# Patient Record
Sex: Male | Born: 1975 | Race: White | Hispanic: No | Marital: Married
Health system: Midwestern US, Academic
[De-identification: ages and names within clinical notes are randomized; demographics above are authoritative.]

---

## 2017-10-28 ENCOUNTER — Encounter: Admit: 2017-10-28 | Discharge: 2017-10-29 | Payer: 59

## 2017-11-12 ENCOUNTER — Encounter: Admit: 2017-11-12 | Discharge: 2017-11-13 | Payer: 59

## 2017-11-23 ENCOUNTER — Encounter: Admit: 2017-11-23 | Discharge: 2017-11-24 | Payer: 59

## 2017-12-07 ENCOUNTER — Encounter: Admit: 2017-12-07 | Discharge: 2017-12-08 | Payer: 59

## 2018-01-26 ENCOUNTER — Encounter: Admit: 2018-01-26 | Discharge: 2018-01-26 | Payer: 59

## 2018-01-28 ENCOUNTER — Encounter: Admit: 2018-01-28 | Discharge: 2018-01-28 | Payer: 59

## 2018-01-28 DIAGNOSIS — M545 Low back pain: Principal | ICD-10-CM

## 2018-01-29 ENCOUNTER — Ambulatory Visit: Admit: 2018-01-29 | Discharge: 2018-01-29 | Payer: 59

## 2018-01-29 ENCOUNTER — Encounter: Admit: 2018-01-29 | Discharge: 2018-01-29 | Payer: 59

## 2018-01-29 DIAGNOSIS — M545 Low back pain: Principal | ICD-10-CM

## 2018-01-29 DIAGNOSIS — S22000A Wedge compression fracture of unspecified thoracic vertebra, initial encounter for closed fracture: ICD-10-CM

## 2018-01-30 ENCOUNTER — Encounter: Admit: 2018-01-30 | Discharge: 2018-01-30 | Payer: 59

## 2018-02-01 ENCOUNTER — Encounter: Admit: 2018-02-01 | Discharge: 2018-02-01 | Payer: 59

## 2018-02-01 DIAGNOSIS — F419 Anxiety disorder, unspecified: ICD-10-CM

## 2018-02-01 DIAGNOSIS — M545 Low back pain: Principal | ICD-10-CM

## 2018-02-12 ENCOUNTER — Encounter: Admit: 2018-02-12 | Discharge: 2018-02-12 | Payer: 59

## 2018-02-12 ENCOUNTER — Ambulatory Visit: Admit: 2018-02-12 | Discharge: 2018-02-13 | Payer: 59

## 2018-02-12 DIAGNOSIS — M545 Low back pain: Principal | ICD-10-CM

## 2018-02-12 DIAGNOSIS — F419 Anxiety disorder, unspecified: ICD-10-CM

## 2018-02-12 MED ORDER — GABAPENTIN 300 MG PO CAP
900 mg | ORAL_CAPSULE | Freq: Three times a day (TID) | ORAL | 3 refills | Status: AC
Start: 2018-02-12 — End: 2018-02-16

## 2018-02-12 MED ORDER — NORTRIPTYLINE 25 MG PO CAP
75 mg | ORAL_CAPSULE | Freq: Every evening | ORAL | 3 refills | Status: AC
Start: 2018-02-12 — End: 2018-02-24

## 2018-02-13 DIAGNOSIS — S22000A Wedge compression fracture of unspecified thoracic vertebra, initial encounter for closed fracture: Principal | ICD-10-CM

## 2018-02-13 DIAGNOSIS — M79605 Pain in left leg: ICD-10-CM

## 2018-02-13 DIAGNOSIS — M47814 Spondylosis without myelopathy or radiculopathy, thoracic region: ICD-10-CM

## 2018-02-13 DIAGNOSIS — M461 Sacroiliitis, not elsewhere classified: ICD-10-CM

## 2018-02-15 ENCOUNTER — Encounter: Admit: 2018-02-15 | Discharge: 2018-02-15 | Payer: 59

## 2018-02-15 DIAGNOSIS — M545 Low back pain: Principal | ICD-10-CM

## 2018-02-15 DIAGNOSIS — F419 Anxiety disorder, unspecified: ICD-10-CM

## 2018-02-16 MED ORDER — GABAPENTIN 600 MG PO TAB
600 mg | ORAL_TABLET | Freq: Three times a day (TID) | ORAL | 3 refills | Status: AC
Start: 2018-02-16 — End: 2018-03-16

## 2018-02-16 MED ORDER — GABAPENTIN 300 MG PO CAP
300 mg | ORAL_CAPSULE | Freq: Three times a day (TID) | ORAL | 3 refills | Status: AC
Start: 2018-02-16 — End: 2018-03-16

## 2018-02-18 ENCOUNTER — Encounter: Admit: 2018-02-18 | Discharge: 2018-02-18 | Payer: 59

## 2018-02-18 ENCOUNTER — Ambulatory Visit: Admit: 2018-02-18 | Discharge: 2018-02-18 | Payer: 59

## 2018-02-18 ENCOUNTER — Ambulatory Visit: Admit: 2018-02-18 | Discharge: 2018-02-19 | Payer: 59

## 2018-02-18 DIAGNOSIS — R52 Pain, unspecified: ICD-10-CM

## 2018-02-18 DIAGNOSIS — M545 Low back pain: Principal | ICD-10-CM

## 2018-02-18 DIAGNOSIS — M461 Sacroiliitis, not elsewhere classified: Principal | ICD-10-CM

## 2018-02-18 DIAGNOSIS — F419 Anxiety disorder, unspecified: ICD-10-CM

## 2018-02-18 MED ORDER — TRIAMCINOLONE ACETONIDE 40 MG/ML IJ SUSP
40 mg | Freq: Once | INTRAMUSCULAR | 0 refills | Status: CP
Start: 2018-02-18 — End: ?

## 2018-02-18 MED ORDER — LIDOCAINE HCL 10 MG/ML (1 %) IJ SOLN
2 mL | Freq: Once | INTRAMUSCULAR | 0 refills | Status: CP
Start: 2018-02-18 — End: ?

## 2018-02-18 MED ORDER — LIDOCAINE (PF) 10 MG/ML (1 %) IJ SOLN
4 mL | Freq: Once | INTRAMUSCULAR | 0 refills | Status: CP | PRN
Start: 2018-02-18 — End: ?

## 2018-02-18 MED ORDER — OTHER MEDICATION
1 | Freq: Once | INTRAMUSCULAR | 0 refills | Status: CP
Start: 2018-02-18 — End: ?

## 2018-02-18 MED ORDER — TRIAMCINOLONE ACETONIDE 40 MG/ML IJ SUSP
40 mg | Freq: Once | INTRAMUSCULAR | 0 refills | Status: CP | PRN
Start: 2018-02-18 — End: ?

## 2018-02-19 ENCOUNTER — Encounter: Admit: 2018-02-19 | Discharge: 2018-02-19 | Payer: 59

## 2018-02-23 ENCOUNTER — Encounter: Admit: 2018-02-23 | Discharge: 2018-02-23 | Payer: 59

## 2018-02-24 MED ORDER — NORTRIPTYLINE 50 MG PO CAP
100 mg | ORAL_CAPSULE | Freq: Every evening | ORAL | 0 refills | Status: AC
Start: 2018-02-24 — End: 2018-03-29

## 2018-03-04 ENCOUNTER — Encounter: Admit: 2018-03-04 | Discharge: 2018-03-04 | Payer: 59

## 2018-03-04 ENCOUNTER — Ambulatory Visit: Admit: 2018-03-04 | Discharge: 2018-03-04 | Payer: 59

## 2018-03-04 ENCOUNTER — Ambulatory Visit: Admit: 2018-03-04 | Discharge: 2018-03-05 | Payer: 59

## 2018-03-04 DIAGNOSIS — M546 Pain in thoracic spine: ICD-10-CM

## 2018-03-04 DIAGNOSIS — F419 Anxiety disorder, unspecified: ICD-10-CM

## 2018-03-04 DIAGNOSIS — M545 Low back pain: Principal | ICD-10-CM

## 2018-03-04 DIAGNOSIS — M47814 Spondylosis without myelopathy or radiculopathy, thoracic region: Principal | ICD-10-CM

## 2018-03-04 DIAGNOSIS — R52 Pain, unspecified: Principal | ICD-10-CM

## 2018-03-04 MED ORDER — BUPIVACAINE (PF) 0.5 % (5 MG/ML) IJ SOLN
6 mL | Freq: Once | INTRAMUSCULAR | 0 refills | Status: CP
Start: 2018-03-04 — End: ?
  Administered 2018-03-04: 18:00:00 6 mL via INTRAMUSCULAR

## 2018-03-05 ENCOUNTER — Encounter: Admit: 2018-03-05 | Discharge: 2018-03-05 | Payer: 59

## 2018-03-16 ENCOUNTER — Encounter: Admit: 2018-03-16 | Discharge: 2018-03-16 | Payer: 59

## 2018-03-16 MED ORDER — GABAPENTIN 300 MG PO CAP
300 mg | ORAL_CAPSULE | Freq: Three times a day (TID) | ORAL | 3 refills | Status: AC
Start: 2018-03-16 — End: 2018-10-14

## 2018-03-16 MED ORDER — GABAPENTIN 600 MG PO TAB
600 mg | ORAL_TABLET | Freq: Three times a day (TID) | ORAL | 3 refills | Status: AC
Start: 2018-03-16 — End: 2018-10-14

## 2018-03-18 ENCOUNTER — Ambulatory Visit: Admit: 2018-03-18 | Discharge: 2018-03-19 | Payer: 59

## 2018-03-18 ENCOUNTER — Encounter: Admit: 2018-03-18 | Discharge: 2018-03-18 | Payer: 59

## 2018-03-18 ENCOUNTER — Ambulatory Visit: Admit: 2018-03-18 | Discharge: 2018-03-18 | Payer: 59

## 2018-03-18 DIAGNOSIS — R52 Pain, unspecified: ICD-10-CM

## 2018-03-18 DIAGNOSIS — M5134 Other intervertebral disc degeneration, thoracic region: ICD-10-CM

## 2018-03-18 DIAGNOSIS — M545 Low back pain: Principal | ICD-10-CM

## 2018-03-18 DIAGNOSIS — F419 Anxiety disorder, unspecified: ICD-10-CM

## 2018-03-18 DIAGNOSIS — M47814 Spondylosis without myelopathy or radiculopathy, thoracic region: Principal | ICD-10-CM

## 2018-03-18 DIAGNOSIS — M47819 Spondylosis without myelopathy or radiculopathy, site unspecified: ICD-10-CM

## 2018-03-18 MED ORDER — BUPIVACAINE (PF) 0.5 % (5 MG/ML) IJ SOLN
6 mL | Freq: Once | INTRAMUSCULAR | 0 refills | Status: CP
Start: 2018-03-18 — End: ?
  Administered 2018-03-18: 18:00:00 6 mL via INTRAMUSCULAR

## 2018-03-19 ENCOUNTER — Encounter: Admit: 2018-03-19 | Discharge: 2018-03-19 | Payer: 59

## 2018-03-19 DIAGNOSIS — M47814 Spondylosis without myelopathy or radiculopathy, thoracic region: Principal | ICD-10-CM

## 2018-03-19 DIAGNOSIS — M79604 Pain in right leg: ICD-10-CM

## 2018-03-25 ENCOUNTER — Encounter: Admit: 2018-03-25 | Discharge: 2018-03-25 | Payer: 59

## 2018-03-31 ENCOUNTER — Ambulatory Visit: Admit: 2018-03-31 | Discharge: 2018-03-31 | Payer: 59

## 2018-03-31 ENCOUNTER — Encounter: Admit: 2018-03-31 | Discharge: 2018-03-31 | Payer: 59

## 2018-03-31 ENCOUNTER — Ambulatory Visit: Admit: 2018-03-31 | Discharge: 2018-04-01 | Payer: 59

## 2018-03-31 DIAGNOSIS — F419 Anxiety disorder, unspecified: ICD-10-CM

## 2018-03-31 DIAGNOSIS — M47814 Spondylosis without myelopathy or radiculopathy, thoracic region: Principal | ICD-10-CM

## 2018-03-31 DIAGNOSIS — M546 Pain in thoracic spine: ICD-10-CM

## 2018-03-31 DIAGNOSIS — M545 Low back pain: Principal | ICD-10-CM

## 2018-03-31 MED ORDER — FENTANYL CITRATE (PF) 50 MCG/ML IJ SOLN
50-100 ug | INTRAVENOUS | 0 refills | Status: DC | PRN
Start: 2018-03-31 — End: 2018-04-12

## 2018-03-31 MED ORDER — MIDAZOLAM 1 MG/ML IJ SOLN
1-2 mg | INTRAVENOUS | 0 refills | Status: DC | PRN
Start: 2018-03-31 — End: 2018-04-12

## 2018-03-31 MED ORDER — LIDOCAINE (PF) 20 MG/ML (2 %) IJ SOLN
5 mL | Freq: Once | INTRAMUSCULAR | 0 refills | Status: CP
Start: 2018-03-31 — End: ?

## 2018-03-31 MED ORDER — DEXAMETHASONE SODIUM PHOS (PF) 10 MG/ML IJ SOLN
10 mg | Freq: Once | 0 refills | Status: CP
Start: 2018-03-31 — End: ?

## 2018-04-01 ENCOUNTER — Encounter: Admit: 2018-04-01 | Discharge: 2018-04-01 | Payer: 59

## 2018-04-09 ENCOUNTER — Encounter: Admit: 2018-04-09 | Discharge: 2018-04-09 | Payer: 59

## 2018-04-09 ENCOUNTER — Ambulatory Visit: Admit: 2018-04-09 | Discharge: 2018-04-10 | Payer: 59

## 2018-04-09 DIAGNOSIS — F419 Anxiety disorder, unspecified: ICD-10-CM

## 2018-04-09 DIAGNOSIS — M545 Low back pain: Principal | ICD-10-CM

## 2018-04-10 DIAGNOSIS — M79604 Pain in right leg: Principal | ICD-10-CM

## 2018-04-10 DIAGNOSIS — M79605 Pain in left leg: ICD-10-CM

## 2018-04-13 ENCOUNTER — Ambulatory Visit: Admit: 2018-04-13 | Discharge: 2018-04-13 | Payer: 59

## 2018-04-13 ENCOUNTER — Encounter: Admit: 2018-04-13 | Discharge: 2018-04-13 | Payer: 59

## 2018-04-13 ENCOUNTER — Ambulatory Visit: Admit: 2018-04-13 | Discharge: 2018-04-14 | Payer: 59

## 2018-04-13 DIAGNOSIS — M47814 Spondylosis without myelopathy or radiculopathy, thoracic region: Principal | ICD-10-CM

## 2018-04-13 DIAGNOSIS — M47816 Spondylosis without myelopathy or radiculopathy, lumbar region: ICD-10-CM

## 2018-04-13 DIAGNOSIS — F419 Anxiety disorder, unspecified: ICD-10-CM

## 2018-04-13 DIAGNOSIS — M545 Low back pain: Principal | ICD-10-CM

## 2018-04-13 MED ORDER — LIDOCAINE (PF) 20 MG/ML (2 %) IJ SOLN
5 mL | Freq: Once | INTRAMUSCULAR | 0 refills | Status: CP
Start: 2018-04-13 — End: ?

## 2018-04-13 MED ORDER — MIDAZOLAM 1 MG/ML IJ SOLN
1-2 mg | INTRAVENOUS | 0 refills | Status: AC | PRN
Start: 2018-04-13 — End: 2019-09-28

## 2018-04-13 MED ORDER — LIDOCAINE (PF) 10 MG/ML (1 %) IJ SOLN
5 mL | Freq: Once | INTRAMUSCULAR | 0 refills | Status: CP
Start: 2018-04-13 — End: ?

## 2018-04-13 MED ORDER — DEXAMETHASONE SODIUM PHOS (PF) 10 MG/ML IJ SOLN
10 mg | Freq: Once | 0 refills | Status: CP
Start: 2018-04-13 — End: ?

## 2018-04-13 MED ORDER — FENTANYL CITRATE (PF) 50 MCG/ML IJ SOLN
50-100 ug | INTRAVENOUS | 0 refills | Status: AC | PRN
Start: 2018-04-13 — End: 2019-09-28

## 2018-10-14 ENCOUNTER — Encounter: Admit: 2018-10-14 | Discharge: 2018-10-14 | Payer: 59

## 2018-10-14 MED ORDER — GABAPENTIN 600 MG PO TAB
600 mg | ORAL_TABLET | Freq: Three times a day (TID) | ORAL | 1 refills | Status: AC
Start: 2018-10-14 — End: 2019-02-14

## 2018-10-14 MED ORDER — GABAPENTIN 300 MG PO CAP
300 mg | ORAL_CAPSULE | Freq: Three times a day (TID) | ORAL | 1 refills | Status: AC
Start: 2018-10-14 — End: 2019-02-14

## 2019-01-17 ENCOUNTER — Encounter: Admit: 2019-01-17 | Discharge: 2019-01-17

## 2019-01-17 DIAGNOSIS — M7918 Myalgia, other site: Secondary | ICD-10-CM

## 2019-01-17 DIAGNOSIS — M5416 Radiculopathy, lumbar region: Secondary | ICD-10-CM

## 2019-01-17 NOTE — Telephone Encounter
Patient reports that pain relief was 100% for 9 months after RFA.     Then pain started to lower back last week.   This weekend, pain started down right leg to foot with some numbness to foot.     Pt requests to continue to a procedure.     Route to Dr. Zollie Scale.

## 2019-01-19 ENCOUNTER — Encounter: Admit: 2019-01-19 | Discharge: 2019-01-19

## 2019-01-25 ENCOUNTER — Encounter: Admit: 2019-01-25 | Discharge: 2019-01-25

## 2019-01-26 MED ORDER — PREGABALIN 100 MG PO CAP
ORAL_CAPSULE | Freq: Every evening | ORAL | 3 refills | Status: AC
Start: 2019-01-26 — End: ?

## 2019-01-26 NOTE — Telephone Encounter
Asks if anything to take for the pain prior to procedure on 7/2.     Route to Dr. Zollie Scale.

## 2019-01-27 ENCOUNTER — Encounter: Admit: 2019-01-27 | Discharge: 2019-01-27

## 2019-02-03 ENCOUNTER — Encounter: Admit: 2019-02-03 | Discharge: 2019-02-03

## 2019-02-03 ENCOUNTER — Ambulatory Visit: Admit: 2019-02-03 | Discharge: 2019-02-03

## 2019-02-03 DIAGNOSIS — M5137 Other intervertebral disc degeneration, lumbosacral region: Secondary | ICD-10-CM

## 2019-02-03 DIAGNOSIS — R52 Pain, unspecified: Secondary | ICD-10-CM

## 2019-02-03 DIAGNOSIS — M5416 Radiculopathy, lumbar region: Principal | ICD-10-CM

## 2019-02-03 DIAGNOSIS — M545 Low back pain: Secondary | ICD-10-CM

## 2019-02-03 DIAGNOSIS — F419 Anxiety disorder, unspecified: Secondary | ICD-10-CM

## 2019-02-03 MED ORDER — IOHEXOL 300 MG IODINE/ML IV SOLN
2 mL | Freq: Once | 0 refills | Status: CP
Start: 2019-02-03 — End: ?

## 2019-02-03 MED ORDER — LIDOCAINE (PF) 10 MG/ML (1 %) IJ SOLN
2 mL | Freq: Once | INTRAMUSCULAR | 0 refills | Status: CP
Start: 2019-02-03 — End: ?

## 2019-02-03 MED ORDER — DEXAMETHASONE SODIUM PHOS (PF) 10 MG/ML IJ SOLN
15 mg | Freq: Once | 0 refills | Status: CP
Start: 2019-02-03 — End: ?

## 2019-02-03 NOTE — Progress Notes
[facility]  INTERVENTIONAL PAIN PROCEDURE HISTORY AND PHYSICAL    No chief complaint on file.  LBP    HISTORY OF PRESENT ILLNESS:  LBP with RLE radicular pain.     Medical History:   Diagnosis Date    Anxiety disorder     from patient questionnaire    Low back pain        Surgical History:   Procedure Laterality Date    HX TONSILLECTOMY  1982       family history is not on file.    Social History     Socioeconomic History    Marital status: Married     Spouse name: Not on file    Number of children: Not on file    Years of education: Not on file    Highest education level: Not on file   Occupational History    Not on file   Tobacco Use    Smoking status: Never Smoker    Smokeless tobacco: Never Used   Substance and Sexual Activity    Alcohol use: Yes     Comment: 3 per week    Drug use: Never    Sexual activity: Yes   Other Topics Concern    Not on file   Social History Narrative    Not on file       No Known Allergies    There were no vitals filed for this visit.    REVIEW OF SYSTEMS: 10 point ROS obtained and negative      PHYSICAL EXAM:    General: The patient is a well-developed, well nourished 43 y.o. male in no acute distress.   HEENT: Head is normocephalic and atraumatic.   Cardiac: Based on palpation, pulse appears to be regular rate and rhythm.   Pulmonary: The patient has unlabored respirations and bilateral symmetric chest excursion.   Abdomen: Soft, nontender, and nondistended.   Extremities: No clubbing, cyanosis, or edema.     Neurologic:   The patient is alert and oriented times 3.     Musculoskeletal:     L-Spine   There is mild-mod paraspinal tenderness.        IMPRESSION:    1. Lumbar radiculopathy    2. DDD (degenerative disc disease), lumbosacral         PLAN: Lumbar Transforaminal Steroid Injection Right L4-S1

## 2019-02-03 NOTE — Discharge Instructions - Supplementary Instructions
GENERAL POST PROCEDURE INSTRUCTIONS    Physician: _________________________________    Procedure Completed Today:  o Joint Injection (hip, knee, shoulder)  o Cervical Epidural Steroid Injection  o Cervical Transforaminal Steroid Injection  o Trigger Point Injection  o Other___________________________ o Thoracic Epidural Steroid Injection  o Lumbar Epidural Steroid Injection  o Lumbar Transforaminal Steroid Injection  o Facet Joint Injection     Important information following your procedure today:  ? You may drive today     ? If you had sedation, you may NOT drive today   Rest at home for the next 6 hours.  You may then begin to resume your normal activities.   DO NOT drive any vehicle, operate any power tools, drink alcohol, make any major decisions, or sign any legal documents for the next 12 hours.  1. Pain relief may not be immediate. It is possible you may even experience an increase in pain during the first 24-48 hours followed by a          gradual decrease of your pain.  2. Though the procedure is generally safe and complications are rare, we do ask that you be aware of any of the following:  ? Any swelling, persistent redness, new bleeding or drainage from the site of the injection.  ? You should not experience a severe headache.  ? You should not run a fever over 101oF.  ? New onset of sharp, severe back and or neck pain.  ? New onset of upper or lower extremity numbness or weakness.  ? New difficulty controlling bowel or bladder function after injection.  ? New shortness of breath.  If any of these occur, please call to report this occurrence to a nurse at [phone]. If you are calling after 4:00pm, on the weekends or holidays please call [phone] and ask to have the pain management resident physician on call for the physician paged or go to your local emergency room.   3. You may experience soreness at the injection site. Ice can be applied at 20 minute intervals for the first 24 hours. The following day              you may alternate ice with heat if you are experiencing muscle tightness, otherwise continue with ice. Ice works best at decreasing           pain. Avoid application of direct heat, hot showers or hot tubs today.  4. Avoid strenuous activity today. You many resume your regular activities and exercise tomorrow.  5. Patients with diabetes may see an elevation in blood sugars for 7-10 days after the injection. It is important to pay close attention to               your diet, check your blood sugars daily and repost extreme elevations to the physician that treats your diabetes.  6. Patients taking daily blood thinners can resume their regular dose this evening.  7. It is important that you take all medications ordered by your pain physician. Taking medications as ordered is an important part of you           pain care plan. If you cannot continue the medication plan, please notify the physician.    Possible side effects to steroids that may occur:  ? Flushing or redness of the face  ? Irritability  ? Fluid retention  ? Change in women's menses      Follow up appointment as needed if in the event  you are unable to keep an appointment please notify the scheduler 24 hours in advance at [phone]. If you have questions for the surgery center, call [facility] at [phone].

## 2019-02-03 NOTE — Procedures
Attending Surgeon: Evelina Bucy, MD    Anesthesia: Local    Pre-Procedure Diagnosis:   1. Lumbar radiculopathy    2. DDD (degenerative disc disease), lumbosacral        Post-Procedure Diagnosis:   1. Lumbar radiculopathy    2. DDD (degenerative disc disease), lumbosacral        [place] [facility] INJECT SNRB/TFESI LUMBAR/SACRAL  Procedure: transforaminal epidural    Laterality: right    Location: lumbar - L4-5 and L5-S1      Consent:   Consent obtained: written  Consent given by: patient  Risks discussed: allergic reaction, bleeding, bruising, infection, nerve damage, no change or worsening in pain, reaction to medication, seizure, swelling and weakness  Alternatives discussed: alternative treatment, delayed treatment and no treatment  Discussed with patient the purpose of the treatment/procedure, other ways of treating my condition, including no treatment/ procedure and the risks and benefits of the alternatives. Patient has decided to proceed with treatment/procedure.        Universal Protocol:  Relevant documents: relevant documents present and verified  Site marked: the operative site was marked  Patient identity confirmed: Patient identify confirmed verbally with patient.        Time out: Immediately prior to procedure a time out was called to verify the correct patient, procedure, equipment, support staff and site/side marked as required      Procedures Details:   Indications: pain   Prep: chlorhexidine  Patient position: prone  Estimated Blood Loss: minimal  Specimens: none  Number of Levels: 2  Guidance: fluoroscopy  Contrast: Procedure confirmed with contrast under live fluoroscopy.  Needle and Epidural Catheter: quincke  Needle size: 25 G  Injection procedure: Incremental injection and Negative aspiration for blood  Patient tolerance: Patient tolerated the procedure well with no immediate complications. Pressure was applied, and hemostasis was accomplished.  Outcome: Pain relieved  Comments: LUMBAR/SACRAL TRANSFORAMINAL EPIDURAL STEROID INJECTION PROCEDURE:    1) right L4-5 and L5-S1 transforaminal epidural steroid injection  2) Fluoroscopic needle guidance    REASON FOR PROCEDURE: Lumbar radiculopathy    PHYSICIAN: Evelina Bucy, MD    MEDICATIONS INJECTED: 0.75 mL of Dexamethasone (7.5 mg) + 0.75 ml lidocaine 1% at each level    LOCAL ANESTHETIC INJECTED: 1 mL of 1% lidocaine per site    SEDATION MEDICATIONS: None    ESTIMATED BLOOD LOSS: None    SPECIMENS REMOVED: None    COMPLICATIONS: None    TECHNIQUE: Time-out was taken to identify the correct patient, procedure and side prior to starting the procedure. Lying in a prone position, the patient was prepped and draped in the usual sterile fashion using DuraPrep and a fenestrated drape. The area to be injected was determined under fluoroscopic guidance. Local anesthetic was given by raising a skin wheal and going down to the hub of a 27-gauge 1.25-inch needle.     The 5-inch 22-gauge Quincke needle was advanced toward the 6 o???clock position of the pedicle at each above-named nerve root level. The needle was advanced to the final position via a lateral fluoroscopic intermittent image. Omnipaque 240 was injected under live fluoroscopy and showed epidural spread and there was no vascular runoff. After a negative aspiration, the medication was then injected.     The procedure was completed without complications and was tolerated well. The patient was monitored after the procedure. The patient (or responsible party) was given post-procedure and discharge instructions to follow at home. The patient was discharged in stable condition.  Estimated blood loss: none or minimal  Specimens: none  Patient tolerated the procedure well with no immediate complications. Pressure was applied, and hemostasis was accomplished.

## 2019-02-10 ENCOUNTER — Encounter: Admit: 2019-02-10 | Discharge: 2019-02-10

## 2019-02-10 DIAGNOSIS — M5416 Radiculopathy, lumbar region: Secondary | ICD-10-CM

## 2019-02-14 ENCOUNTER — Encounter: Admit: 2019-02-14 | Discharge: 2019-02-14

## 2019-02-14 ENCOUNTER — Ambulatory Visit: Admit: 2019-02-14 | Discharge: 2019-02-15

## 2019-02-14 DIAGNOSIS — F419 Anxiety disorder, unspecified: Secondary | ICD-10-CM

## 2019-02-14 DIAGNOSIS — M5410 Radiculopathy, site unspecified: Secondary | ICD-10-CM

## 2019-02-14 DIAGNOSIS — M5441 Lumbago with sciatica, right side: Secondary | ICD-10-CM

## 2019-02-14 DIAGNOSIS — M545 Low back pain: Secondary | ICD-10-CM

## 2019-02-14 MED ORDER — KETOROLAC 60 MG/2 ML IM SOLN
60 mg | Freq: Once | INTRAMUSCULAR | 0 refills | Status: CP
Start: 2019-02-14 — End: ?
  Administered 2019-02-14: 21:00:00 60 mg via INTRAMUSCULAR

## 2019-02-16 NOTE — Progress Notes
Date of Service: 02/14/2019    George Rodgers is a 43 y.o. male.  DOB: May 15, 1976  MRN: [id number]     Subjective:             History of Present Illness  43 year old male presents with right hip and lower right back pain.  He says he has had chronic back pain off and on for the last 3 years.  This particular episode is been occurring for about the last 10 days.  He describes numbness down his right leg and a somewhat radicular pattern.  Previously he said he has been diagnosed with a bulging disc.  He had a laser ablation as well as epidurals.  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  10 days ago he had a shot in his right hip for what the doctor apparently thought was bursitis.  He has been taking oxycodone 10 mg without much relief.  He is not been taking much in the way of nonsteroidals.       Review of Systems   Constitutional: Negative for chills, fatigue, fever and unexpected weight change.   Respiratory: Negative for cough.    Cardiovascular: Negative for chest pain.   Gastrointestinal: Negative for constipation, diarrhea, nausea and vomiting.   Musculoskeletal: Positive for back pain. Negative for joint swelling.   Neurological: Negative for weakness and headaches.         Objective:         ??? baclofen(#) (LIORESAL) 10 mg/mL 10 mg three times daily.   ??? Calcium-Cholecalciferol (D3) (CALCIUM 600 WITH VITAMIN D3) 600 mg(1,500mg ) -400 unit cap    ??? duloxetine DR (CYMBALTA) 60 mg capsule Take 60 mg by mouth daily.   ??? MELOXICAM PO twice daily.   ??? oxyCODONE/acetaminophen (PERCOCET) 10/325 mg tablet    ??? pregabalin (LYRICA) 100 mg capsule Take one capsule by mouth at bedtime daily for 5 days, THEN one capsule twice daily for 5 days, THEN one capsule three times daily for 20 days. *replaces gabapentin*     Vitals:    02/14/19 1451   BP: (!) 142/80   Pulse: 96   Resp: 20   Temp: 36.7 ???C (98 ???F)   TempSrc: Oral Weight: (!) 184.5 kg (406 lb 12.8 oz)   Height: 188 cm (74)   PainSc: Ten     Body mass index is 52.23 kg/m???.     Physical Exam  Vitals signs and nursing note reviewed.   Constitutional:       Appearance: He is well-developed.      Comments: Morbidly obese male who is in moderate pain   Cardiovascular:      Rate and Rhythm: Normal rate and regular rhythm.   Musculoskeletal:      Comments: Patient has difficulty standing erect.  He tends to splint his back slightly forward but cannot been much further forward without pain.  His gait is antalgic   Neurological:      Mental Status: He is alert and oriented to person, place, and time.   Psychiatric:         Behavior: Behavior normal.         Thought Content: Thought content normal.         Judgment: Judgment normal.              Assessment and Plan:  1. Radicular pain of right lower extremity     2. Acute midline low back  pain with right-sided sciatica     Plan:  Toradol 60 mg IM now  Encouraged patient to turn to his primary care physician or his previous providers for his back pain for further evaluation and treatment

## 2019-02-21 ENCOUNTER — Encounter: Admit: 2019-02-21 | Discharge: 2019-02-21

## 2019-02-21 DIAGNOSIS — M5416 Radiculopathy, lumbar region: Secondary | ICD-10-CM

## 2019-02-24 ENCOUNTER — Ambulatory Visit: Admit: 2019-02-24 | Discharge: 2019-02-25

## 2019-02-24 ENCOUNTER — Encounter: Admit: 2019-02-24 | Discharge: 2019-02-24

## 2019-02-24 DIAGNOSIS — M545 Low back pain: Secondary | ICD-10-CM

## 2019-02-24 DIAGNOSIS — M7918 Myalgia, other site: Secondary | ICD-10-CM

## 2019-02-24 DIAGNOSIS — M5137 Other intervertebral disc degeneration, lumbosacral region: Secondary | ICD-10-CM

## 2019-02-24 DIAGNOSIS — M47819 Spondylosis without myelopathy or radiculopathy, site unspecified: Secondary | ICD-10-CM

## 2019-02-24 DIAGNOSIS — F419 Anxiety disorder, unspecified: Secondary | ICD-10-CM

## 2019-02-25 DIAGNOSIS — M5416 Radiculopathy, lumbar region: Secondary | ICD-10-CM

## 2019-02-25 MED ORDER — PREGABALIN 150 MG PO CAP
150 mg | ORAL_CAPSULE | Freq: Three times a day (TID) | ORAL | 2 refills | Status: DC
Start: 2019-02-25 — End: 2019-07-05

## 2019-05-06 ENCOUNTER — Encounter: Admit: 2019-05-06 | Discharge: 2019-05-06 | Payer: 59

## 2019-05-09 MED ORDER — DEXAMETHASONE SODIUM PHOS (PF) 10 MG/ML IJ SOLN
15 mg | Freq: Once | 0 refills | Status: CP
Start: 2019-05-09 — End: ?
  Administered 2019-05-09: 22:00:00 15 mg

## 2019-05-09 MED ORDER — IOHEXOL 240 MG IODINE/ML IV SOLN
2.5 mL | Freq: Once | EPIDURAL | 0 refills | Status: CP
Start: 2019-05-09 — End: ?
  Administered 2019-05-09: 22:00:00 2.5 mL via EPIDURAL

## 2019-05-09 NOTE — Progress Notes

## 2019-05-09 NOTE — Patient Instructions
Procedure Completed Today: Lumbar Transforaminal Steroid Injection    Important information following your procedure today: You may drive today    1. Pain relief may not be immediate. It is possible you may even experience an increase in pain during the first 24-48 hours followed by a gradual decrease of your pain.  2. Though the procedure is generally safe and complications are rare, we do ask that you be aware of any of the following:   ? Any swelling, persistent redness, new bleeding, or drainage from the site of the injection.  ? You should not experience a severe headache.  ? You should not run a fever over 101? F.  ? New onset of sharp, severe back & or neck pain.  ? New onset of upper or lower extremity numbness or weakness.  ? New difficulty controlling bowel or bladder function after the injection.  ? New shortness of breath.    If any of these occur, please call to report this occurrence to a nurse at [phone]. If you are calling after 4:00 p.m., on weekends or holidays please call [phone] and ask to have the resident physician on call for the physician paged or go to your local emergency room.  3. You may experience soreness at the injection site. Ice can be applied at 20 minute intervals. Avoid application of direct heat, hot showers or hot tubs today.  4. Avoid strenuous activity today. You may resume your regular activities and exercise tomorrow.  5. Patients with diabetes may see an elevation in blood sugars for 7-10 days after the injection. It is important to pay close attention to your diet, check your blood sugars daily and report extreme elevations to the physician that treats your diabetes.  6. Patients taking a daily blood thinner can resume their regular dose this evening.  7. It is important that you take all medications ordered by your pain physician. Taking medication as ordered is an important part of your pain care plan. If you cannot continue the medication plan, please notify the physician.     Possible side effects to steroids that may occur:  ? Flushing or redness of the face  ? Irritability  ? Fluid retention      The following medications were used: Lidocaine , Decadron and Contrast Dye

## 2019-05-09 NOTE — Progress Notes
[facility]INE [facility]ENTER  INTERVENTIONAL PAIN PROCEDURE HISTORY AND PHYSICAL    No chief complaint on file.  Low back pain     HISTORY OF PRESENT ILLNESS:    43 yo with a hx of lumbar radiculopathy who presents for R L4-S1 TFESI. No recent infections. No anticoagulant/antiplatelet agents to be held.     Medical History:   Diagnosis Date    Anxiety disorder     from patient questionnaire    Low back pain        Surgical History:   Procedure Laterality Date    HX TONSILLECTOMY  1982       family history is not on file.    Social History     Socioeconomic History    Marital status: Married     Spouse name: Not on file    Number of children: Not on file    Years of education: Not on file    Highest education level: Not on file   Occupational History    Not on file   Tobacco Use    Smoking status: Never Smoker    Smokeless tobacco: Never Used   Substance and Sexual Activity    Alcohol use: Yes     Comment: 3 per week    Drug use: Never    Sexual activity: Yes   Other Topics Concern    Not on file   Social History Narrative    Not on file       No Known Allergies    There were no vitals filed for this visit.    REVIEW OF SYSTEMS: 10 point ROS obtained and negative except mentioned above       PHYSICAL EXAM:  Gen: NAD  HEENT: NC/AT  CV: RRR   Resp: Non-labored  GI: S/NT/ND  Extrem: No c/c/e         IMPRESSION:    1. Lumbar radiculopathy         PLAN: Lumbar Transforaminal Steroid Injection R L4-S1 TFESI     Karen Kitchens MD PGY-5  Interventional Pain Fellow

## 2019-05-09 NOTE — Procedures
Attending Surgeon: Evelina Bucy, MD    Anesthesia: Local    Pre-Procedure Diagnosis:   1. Lumbar radiculopathy        Post-Procedure Diagnosis:   1. Lumbar radiculopathy        [place] [facility] INJECT SNRB/TFESI LUMBAR/SACRAL  Procedure: transforaminal epidural    Laterality: right    Location: lumbar - L4-5 and L5-S1      Consent:   Consent obtained: written  Consent given by: patient  Risks discussed: allergic reaction, bleeding, bruising, infection, nerve damage, no change or worsening in pain, reaction to medication, seizure, swelling and weakness  Alternatives discussed: alternative treatment, delayed treatment and no treatment  Discussed with patient the purpose of the treatment/procedure, other ways of treating my condition, including no treatment/ procedure and the risks and benefits of the alternatives. Patient has decided to proceed with treatment/procedure.        Universal Protocol:  Relevant documents: relevant documents present and verified  Site marked: the operative site was marked  Patient identity confirmed: Patient identify confirmed verbally with patient.        Time out: Immediately prior to procedure a time out was called to verify the correct patient, procedure, equipment, support staff and site/side marked as required      Procedures Details:   Indications: pain   Prep: chlorhexidine  Patient position: prone  Estimated Blood Loss: minimal  Specimens: none  Number of Levels: 2  Guidance: fluoroscopy  Contrast: Procedure confirmed with contrast under live fluoroscopy.  Needle and Epidural Catheter: quincke  Needle size: 25 G  Injection procedure: Incremental injection and Negative aspiration for blood  Patient tolerance: Patient tolerated the procedure well with no immediate complications. Pressure was applied, and hemostasis was accomplished.  Outcome: Pain relieved  Comments:   LUMBAR/SACRAL TRANSFORAMINAL EPIDURAL STEROID INJECTION PROCEDURE: 1) right L4-5 and L5-S1 transforaminal epidural steroid injection  2) Fluoroscopic needle guidance    REASON FOR PROCEDURE: Lumbar radiculopathy    PHYSICIAN: Evelina Bucy, MD    MEDICATIONS INJECTED: 0.75 mL of Dexamethasone (7.5 mg) + 0.75 ml lidocaine 1% at each level    LOCAL ANESTHETIC INJECTED: 1 mL of 1% lidocaine per site    SEDATION MEDICATIONS: None    ESTIMATED BLOOD LOSS: None    SPECIMENS REMOVED: None    COMPLICATIONS: None    TECHNIQUE: Time-out was taken to identify the correct patient, procedure and side prior to starting the procedure. Lying in a prone position, the patient was prepped and draped in the usual sterile fashion using DuraPrep and a fenestrated drape. The area to be injected was determined under fluoroscopic guidance. Local anesthetic was given by raising a skin wheal and going down to the hub of a 27-gauge 1.25-inch needle.     The 7-inch 22-gauge Quincke needle was advanced toward the 6 o?clock position of the pedicle at each above-named nerve root level. The needle was advanced to the final position via a lateral fluoroscopic intermittent image. Omnipaque 240 was injected under live fluoroscopy and showed epidural spread and there was no vascular runoff. After a negative aspiration, the medication was then injected.     The procedure was completed without complications and was tolerated well. The patient was monitored after the procedure. The patient (or responsible party) was given post-procedure and discharge instructions to follow at home. The patient was discharged in stable condition.           Estimated blood loss: none or minimal  Specimens: none  Patient tolerated  the procedure well with no immediate complications. Pressure was applied, and hemostasis was accomplished.

## 2019-05-25 ENCOUNTER — Encounter: Admit: 2019-05-25 | Discharge: 2019-05-25 | Payer: 59

## 2019-05-26 ENCOUNTER — Encounter: Admit: 2019-05-26 | Discharge: 2019-05-26 | Payer: 59

## 2019-06-06 ENCOUNTER — Encounter: Admit: 2019-06-06 | Discharge: 2019-06-06 | Payer: 59

## 2019-06-22 ENCOUNTER — Encounter: Admit: 2019-06-22 | Discharge: 2019-06-22 | Payer: 59

## 2019-07-05 ENCOUNTER — Encounter: Admit: 2019-07-05 | Discharge: 2019-07-05 | Payer: 59

## 2019-07-05 MED ORDER — PREGABALIN 150 MG PO CAP
150 mg | ORAL_CAPSULE | Freq: Three times a day (TID) | ORAL | 2 refills | Status: DC
Start: 2019-07-05 — End: 2019-11-10

## 2019-07-05 NOTE — Telephone Encounter
Refill request for Lyrica 150 mg po tid    Route to [facility] for approval.

## 2019-08-11 ENCOUNTER — Encounter: Admit: 2019-08-11 | Discharge: 2019-08-11 | Payer: 59

## 2019-08-11 ENCOUNTER — Ambulatory Visit: Admit: 2019-08-11 | Discharge: 2019-08-11 | Payer: 59

## 2019-08-11 DIAGNOSIS — M5417 Radiculopathy, lumbosacral region: Secondary | ICD-10-CM

## 2019-08-11 DIAGNOSIS — M5137 Other intervertebral disc degeneration, lumbosacral region: Secondary | ICD-10-CM

## 2019-08-11 DIAGNOSIS — M5416 Radiculopathy, lumbar region: Secondary | ICD-10-CM

## 2019-08-11 DIAGNOSIS — F419 Anxiety disorder, unspecified: Secondary | ICD-10-CM

## 2019-08-11 DIAGNOSIS — M545 Low back pain: Secondary | ICD-10-CM

## 2019-08-11 MED ORDER — TRIAMCINOLONE ACETONIDE 40 MG/ML IJ SUSP
80 mg | Freq: Once | EPIDURAL | 0 refills | Status: CP
Start: 2019-08-11 — End: ?

## 2019-08-11 MED ORDER — IOHEXOL 300 MG IODINE/ML IV SOLN
2 mL | Freq: Once | 0 refills | Status: CP
Start: 2019-08-11 — End: ?

## 2019-08-11 MED ORDER — LIDOCAINE (PF) 10 MG/ML (1 %) IJ SOLN
2 mL | Freq: Once | INTRAMUSCULAR | 0 refills | Status: CP
Start: 2019-08-11 — End: ?

## 2019-08-11 NOTE — Procedures
Attending Surgeon: Evelina Bucy, MD    Anesthesia: Local    Pre-Procedure Diagnosis:   1. Lumbosacral radiculopathy    2. DDD (degenerative disc disease), lumbosacral    3. Lumbar radiculopathy        Post-Procedure Diagnosis:   1. Lumbosacral radiculopathy    2. DDD (degenerative disc disease), lumbosacral    3. Lumbar radiculopathy        [place] [facility] SP[facility] INJECT INTERLAM LMBR/SAC  Procedure: epidural - interlaminar    Laterality: n/a    Location: lumbar ESI with imaging - L4-5      Consent:   Consent obtained: written  Consent given by: patient  Risks discussed: allergic reaction, bleeding, bruising, infection, nerve damage, no change or worsening in pain, reaction to medication, seizure, swelling and weakness  Alternatives discussed: alternative treatment, delayed treatment and no treatment  Discussed with patient the purpose of the treatment/procedure, other ways of treating my condition, including no treatment/ procedure and the risks and benefits of the alternatives. Patient has decided to proceed with treatment/procedure.        Universal Protocol:  Relevant documents: relevant documents present and verified  Site marked: the operative site was marked  Patient identity confirmed: Patient identify confirmed verbally with patient.        Time out: Immediately prior to procedure a time out was called to verify the correct patient, procedure, equipment, support staff and site/side marked as required      Procedures Details:   Indications: pain   Prep: chlorhexidine  Patient position: prone  Estimated Blood Loss: minimal  Specimens: none  Amount Injected:   L4-5: 5mL    Number of Levels: 1  Guidance: fluoroscopy  Needle and Epidural Catheter: tuohy  Needle size: 18 G  Injection procedure: Incremental injection and Negative aspiration for blood  Patient tolerance: Patient tolerated the procedure well with no immediate complications. Pressure was applied, and hemostasis was accomplished.  Outcome: Pain relieved Comments:   LUMBAR INTERLAMINAR EPIDURAL STEROID INJECTION    PROCEDURE:  1) L4-5 interlaminar epidural steroid injection  2) Fluoroscopic needle guidance    REASON FOR PROCEDURE: Lumbar radiculopathy, DDD (lumbar)    PHYSICIAN: Evelina Bucy, MD    MEDICATIONS INJECTED: 2 mL of triamciniolone (80 mg) and 3 mL of sterile, preservative-free saline    LOCAL ANESTHETIC INJECTED: 1 mL of 1% lidocaine    SEDATION MEDICATIONS: None    SPECIMENS REMOVED: None    ESTIMATED BLOOD LOSS: None    COMPLICATIONS: None    TECHNIQUE: Time-out was taken to identify the correct patient, procedure and side prior to starting the procedure. With the patient lying in the prone position, the area was prepped and draped in the usual sterile fashion using DuraPrep and a fenestrated drape. The level above was determined using fluoroscopy. A local anesthetic was given using a 27-gauge 1.25-inch needle by raising a skin wheal and going down to the hub. A 3.5-inch 18-gauge Tuohy needle was introduced under intermittent fluoroscopic guidance. The needle was advanced through the ligamentum flavum using the loss of resistance technique. Once the tip of the needle was thought to be in the desired position, the contrast was injected to confirm only epidural spread, and no vascular runoff. The injectate was then injected slowly with intermittent negative aspiration. The procedure was completed without complications and was tolerated well. The patient was monitored after the procedure. The patient (or responsible party) was given post-procedure and discharge instructions to follow at home. The patient was discharged  in stable condition.     Evelina Bucy, MD, MBA              Estimated blood loss: none or minimal  Specimens: none  Patient tolerated the procedure well with no immediate complications. Pressure was applied, and hemostasis was accomplished.

## 2019-08-11 NOTE — Discharge Instructions - Supplementary Instructions
GENERAL POST PROCEDURE INSTRUCTIONS  Physician: _________________________________  Procedure Completed Today:  o Joint Injection (hip, knee, shoulder)  o Cervical Epidural Steroid Injection  o Cervical Transforaminal Steroid Injection  o Trigger Point Injection  o Caudal Epidural Steroid Injection  o Pudendal Nerve Block  o Other _____________________ o Thoracic Epidural Steroid Injection  o Lumbar Epidural Steroid Injection  o Lumbar Transforaminal Steroid Injection  o Facet Joint Injection  o Celiac Nerve Block  o Sacrococcygeal  o Sacroiliac Joint Injection   Important information following your procedure today:  o You may drive today     o If you had sedation, you may NOT drive today  ? Rest at home for the next 6 hours.  You may then begin to resume your normal activities.  ? DO NOT drive any vehicle, operate any power tools, drink alcohol, make any major decisions, or sign any legal documents for the next 12 hours.  1. Pain relief may not be immediate. It is possible you may even experience an increase in pain during the first 24-48 hours followed by a gradual decrease of your pain.  2. Though the procedure is generally safe and complications are rare, we do ask that you be aware of any of the following:  ? Any swelling, persistent redness, new bleeding or drainage from the site of the injection.  ? You should not experience a severe headache.  ? You should not run a fever over 101oF.  ? New onset of sharp, severe back and or neck pain.  ? New onset of upper or lower extremity numbness or weakness.  ? New difficulty controlling bowel or bladder function after injection.  ? New shortness of breath.  If any of these occur, please call to report this occurrence to a nurse at [phone]. If you are calling after 4:00pm, on the weekends or holidays please call [phone] and ask to have the pain management resident physician on call for the physician paged or go to your local emergency room. 3. You may experience soreness at the injection site. Ice can be applied at 20 minute intervals for the first 24 hours. The following day you may alternate ice with heat if you are experiencing muscle tightness, otherwise continue with ice. Ice works best at decreasing pain. Avoid application of direct heat, hot showers or hot tubs today.  4. Avoid strenuous activity today. You many resume your regular activities and exercise tomorrow.  5. Patients with diabetes may see an elevation in blood sugars for 7-10 days after the injection. It is important to pay close attention to your diet, check your blood sugars daily and repost extreme elevations to the physician that treats your diabetes.  6. Patients taking daily blood thinners can resume their regular dose this evening.  7. It is important that you take all medications ordered by your pain physician. Taking medications as ordered is an important part of you pain care plan. If you cannot continue the medication plan, please notify the physician.    Possible side effects to steroids that may occur:  ? Flushing or redness of the face  ? Irritability  ? Fluid retention  ? Change in women's menses    Follow up appointment as needed if in the event you are unable to keep an appointment please notify the scheduler 24 hours in advance at [phone]. If you have questions for the surgery center, call [facility] at [phone].

## 2019-08-11 NOTE — Progress Notes
[facility]  INTERVENTIONAL PAIN PROCEDURE HISTORY AND PHYSICAL    No chief complaint on file.  Low back pain     HISTORY OF PRESENT ILLNESS:    44 yo with a hx of lumbar radiculopathy who presents for L4-5 ILESI as he has not had sufficient duration of benefit with TFESI. No recent infections. No anticoagulant/antiplatelet agents to be held.     Medical History:   Diagnosis Date    Anxiety disorder     from patient questionnaire    Low back pain        Surgical History:   Procedure Laterality Date    HX TONSILLECTOMY  1982       family history is not on file.    Social History     Socioeconomic History    Marital status: Married     Spouse name: Not on file    Number of children: Not on file    Years of education: Not on file    Highest education level: Not on file   Occupational History    Not on file   Tobacco Use    Smoking status: Never Smoker    Smokeless tobacco: Never Used   Substance and Sexual Activity    Alcohol use: Yes     Comment: 3 per week    Drug use: Never    Sexual activity: Yes   Other Topics Concern    Not on file   Social History Narrative    Not on file       No Known Allergies    Vitals:    08/11/19 1629   BP: (!) 141/87   BP Source: Arm, Left Lower   Temp: 36.5 C (97.7 F)   SpO2: 100%   Weight: (!) 184.4 kg (406 lb 9.6 oz)   Height: 188 cm (74")       REVIEW OF SYSTEMS: 10 point ROS obtained and negative except mentioned above       PHYSICAL EXAM:  Gen: NAD  HEENT: NC/AT  CV: RRR   Resp: Non-labored  GI: S/NT/ND  Extrem: No c/c/e         IMPRESSION:    1. Lumbosacral radiculopathy    2. DDD (degenerative disc disease), lumbosacral    3. Lumbar radiculopathy         PLAN: L4-5 ILESI

## 2019-08-12 ENCOUNTER — Encounter: Admit: 2019-08-12 | Discharge: 2019-08-12 | Payer: 59

## 2019-08-12 ENCOUNTER — Emergency Department: Admit: 2019-08-12 | Discharge: 2019-08-12 | Payer: 59

## 2019-08-12 DIAGNOSIS — W19XXXA Unspecified fall, initial encounter: Secondary | ICD-10-CM

## 2019-08-12 DIAGNOSIS — M545 Low back pain: Secondary | ICD-10-CM

## 2019-08-12 DIAGNOSIS — F419 Anxiety disorder, unspecified: Secondary | ICD-10-CM

## 2019-08-12 DIAGNOSIS — M546 Pain in thoracic spine: Secondary | ICD-10-CM

## 2019-08-12 MED ORDER — LIDOCAINE 5 % TP PTMD
1 | Freq: Once | TOPICAL | 0 refills | Status: DC
Start: 2019-08-12 — End: 2019-08-12
  Administered 2019-08-12: 18:00:00 1 via TOPICAL

## 2019-08-12 MED ORDER — HYDROCODONE-ACETAMINOPHEN 5-325 MG PO TAB
1-2 | ORAL_TABLET | ORAL | 0 refills | 15.00000 days | Status: AC | PRN
Start: 2019-08-12 — End: ?

## 2019-08-12 MED ORDER — HYDROCODONE-ACETAMINOPHEN 5-325 MG PO TAB
2 | Freq: Once | ORAL | 0 refills | Status: CP
Start: 2019-08-12 — End: ?
  Administered 2019-08-12: 16:00:00 2 via ORAL

## 2019-08-12 NOTE — ED Triage Notes
Patient states that he has chronic back pain with bulging discs. Patient received L4-6 steroid injections yesterday in clinic. This morning patient fell down 3-6 stairs this morning. Patient dtates he struck his tailbone and lower back. He experienced immediate pain in area of injections. Patient denies hitting head or any other injuries. Patient states that he has severe pain and is unable to ambulate at this time because of pain. Patient denies fever, illness, cough, SOA.   Patient in bed with bed low and locked. Wife at bedside.  All belongings with wife at bedside.

## 2019-08-12 NOTE — ED Provider Notes
George Rodgers is a 44 y.o. male.    Chief Complaint:  Chief Complaint   Patient presents with   ? Back Pain     lower back   ? Numbness     bilat feet, heaviness in bilat legs       History of Present Illness:  George Rodgers is a 44 y.o. male, with a history of compression fracture, bulging disc, ,lumbar radiculopathy, anxiety, and back pain who presents to the emergency department for back pain. Patient with L4-5 interlaminar epidural steroid injection performed yesterday. Patient has received the injections in the past with moderate relief noted within the week. He presents here today stating he was walking down steps with socks on when he slipped falling to bottom then slid down about 6 stairs. With fall patient noted immediate pain to upper lumbar lower thoracic. Patient was able to stand and walk with assistance from his wife. Historically patient with complaints of sciatica pain and intermittent numbness to bilateral feet. Patient currently with complaints of numbness to feet, consistent with historical complaints. Denies bowel or bladder incontinence, denies saddle anesthesia. Denies any treatment post fall prior to arrival. Reports taking baclofen, meloxicam, lyrica, cymbalta,       History provided by:  Patient and medical records      Review of Systems:  Review of Systems   Constitutional: Negative for activity change, chills, fatigue and fever.   HENT: Negative for congestion and sore throat.    Eyes: Negative for visual disturbance.   Respiratory: Negative for cough and shortness of breath.    Cardiovascular: Negative for chest pain.   Gastrointestinal: Negative for abdominal pain, diarrhea, nausea and vomiting.   Genitourinary: Negative for difficulty urinating and dysuria.   Musculoskeletal: Positive for back pain. Negative for myalgias.   Skin: Negative for rash.   Neurological: Positive for numbness. Negative for headaches.   Psychiatric/Behavioral: Negative for confusion. Allergies:  Patient has no known allergies.    Past Medical History:  Medical History:   Diagnosis Date   ? Anxiety disorder     from patient questionnaire   ? Low back pain        Past Surgical History:  Surgical History:   Procedure Laterality Date   ? HX TONSILLECTOMY  1982       Pertinent medical/surgical history reviewed  Medical History:   Diagnosis Date   ? Anxiety disorder     from patient questionnaire   ? Low back pain      Surgical History:   Procedure Laterality Date   ? HX TONSILLECTOMY  1982       Social History:  Social History     Tobacco Use   ? Smoking status: Never Smoker   ? Smokeless tobacco: Never Used   Substance Use Topics   ? Alcohol use: Yes     Comment: 3 per week   ? Drug use: Never     Social History     Substance and Sexual Activity   Drug Use Never       Family History:  No family history on file.    Vitals:  ED Vitals    Date and Time T BP P RR SPO2P SPO2 User   08/12/19 1200 -- 161/92 -- -- 86 97 % TH   08/12/19 1130 -- 159/90 -- -- 72 97 % TH   08/12/19 1100 -- 166/95 -- -- 69 95 % TH   08/12/19 1030 -- 160/78 -- --  67 95 % TH   08/12/19 1000 -- 158/90 -- -- 72 95 % TH   08/12/19 0950 -- -- -- -- 71 97 % TH   08/12/19 0949 -- 146/89 -- -- -- -- TH   08/12/19 0930 -- 187/93 -- -- 79 94 % TH   08/12/19 0908 36.8 ?C (98.2 ?F) 204/95 -- 24 PER MINUTE 92 97 % TH   08/12/19 0904 -- 204/95 -- -- 96 97 % TH          Physical Exam:  Physical Exam  Vitals signs and nursing note reviewed.   Constitutional:       Appearance: He is well-developed. He is obese.   HENT:      Head: Normocephalic and atraumatic.      Right Ear: External ear normal.      Left Ear: External ear normal.   Eyes:      Conjunctiva/sclera: Conjunctivae normal.   Neck:      Musculoskeletal: Neck supple.   Cardiovascular:      Rate and Rhythm: Normal rate and regular rhythm.      Heart sounds: Normal heart sounds.   Pulmonary:      Effort: Pulmonary effort is normal.      Breath sounds: Normal breath sounds.   Abdominal: Palpations: Abdomen is soft.      Tenderness: There is no abdominal tenderness.   Musculoskeletal:      Thoracic back: He exhibits bony tenderness. He exhibits no tenderness.      Lumbar back: He exhibits bony tenderness.   Skin:     General: Skin is warm and dry.   Neurological:      Mental Status: He is alert and oriented to person, place, and time.      Sensory: Sensation is intact.      Comments: Plantar and dorsiflexion 2/5 on L and 3/5 on R   Psychiatric:         Mood and Affect: Mood normal.         Laboratory Results:  Labs Reviewed - No data to display       Radiology Interpretation:    CT L-SPINE WO CONTRAST   Final Result         Thoracic spine:      1.  No acute fracture or subluxation of the thoracic spine.   2.  Mild chronic anterior wedging at T6-T10.   3.  Mild to moderate degenerative changes of the mid and lower thoracic spine, resulting in mild multilevel spinal stenosis, better evaluated on prior MRI and multilevel neural foraminal narrowing most pronounced on the left at T9-10 of mild to moderate degree.      Lumbar spine:      1.  No acute fracture or subluxation of the lumbar spine.   2.  Mild lower lumbar spondylosis as described, and most pronounced at L4-5 with mild spinal stenosis and moderate right and mild left neuroforaminal stenosis.      By my electronic signature, I attest that I have personally reviewed the images for this examination and formulated the interpretations and opinions expressed in this report          Finalized by Kimber Relic, M.D. on 08/12/2019 11:53 AM. Dictated by Zenia Resides, M.D. on 08/12/2019 11:05 AM.         CT T-SPINE WO CONTRAST   Final Result         Thoracic spine:      1.  No  acute fracture or subluxation of the thoracic spine.   2.  Mild chronic anterior wedging at T6-T10. 3.  Mild to moderate degenerative changes of the mid and lower thoracic spine, resulting in mild multilevel spinal stenosis, better evaluated on prior MRI and multilevel neural foraminal narrowing most pronounced on the left at T9-10 of mild to moderate degree.      Lumbar spine:      1.  No acute fracture or subluxation of the lumbar spine.   2.  Mild lower lumbar spondylosis as described, and most pronounced at L4-5 with mild spinal stenosis and moderate right and mild left neuroforaminal stenosis.      By my electronic signature, I attest that I have personally reviewed the images for this examination and formulated the interpretations and opinions expressed in this report          Finalized by Kimber Relic, M.D. on 08/12/2019 11:53 AM. Dictated by Zenia Resides, M.D. on 08/12/2019 11:05 AM.               EKG:  n/a    ED Course:  Pt seen and evaluated in the ER for acute on chronic back pain s/p fall  Pt given norco #2 in ED for pain with improvement to 7/10  Lidoderm patch placed  CT T and L spine revealed no acute process  On repeat exam dorsiflexion and plantarflexion 5/5 bilaterally and pt reports improvement in numbness to feet  Pt was able to ambulate in ED  Discussed all results with family and plan of care for DC home  Return precautions discussed, questions answered, pt agreeable to the plan  Discharge instructions and prescriptions provided to pt and pt ambulated out of ED in stable condition.         ED Scoring:      MDM  Reviewed: nursing note, vitals and previous chart  Interpretation: labs and CT scan        Facility Administered Meds:  Medications   lidocaine (LIDODERM) 5 % topical patch 1 patch (1 patch Topical Patch/Topical Applied 08/12/19 1214)   HYDROcodone/acetaminophen (NORCO) 5/325 mg tablet 2 tablet (2 tablets Oral Given 08/12/19 0951)         Clinical Impression:  Clinical Impression   Fall, initial encounter   Acute midline thoracic back pain       Disposition/Follow up  ED Disposition ED Disposition    Discharge        Steva Ready, MD  [address]  [address]  [place] [address]  [phone]    Schedule an appointment as soon as possible for a visit in 1 week  If symptoms worsen      Medications:  Discharge Medication List as of 08/12/2019 12:07 PM      START taking these medications    Details   HYDROcodone/acetaminophen (NORCO) 5/325 mg tablet Take one tablet to two tablets by mouth every 4 hours as needed for Pain, Disp-20 tablet,R-0, Print             Procedure Notes:  Procedures      Attestation / Supervision:  Jeronimo Norma, am scribing for and in the presence of Ellyn Hack, APRN.      Daryel November    Attestation / Supervision Note concerning Marinus Maw: I personally performed the E/M including history, physical exam, and MDM., The service was provided by the APP alone with immediate availability of a physician in the ED. and I, Fae Blossom  Corey Skains, APRN-NP, personally performed the services described in this documentation as scribed in my presence and it is both accurate and complete.    Lysbeth Galas, APRN-NP

## 2019-08-12 NOTE — ED Notes
Patient back from Ct wihtout incident. Resting in bed. Bed low and locked. Call light in reach.

## 2019-08-12 NOTE — Progress Notes
Patient given D/C instructions an prescription. All questions and concerns addressed. Patient dressed independently and collected all belongings. Patient wife took all belongings to car. Patient escorted to car via wheelchair by staff RN. Patient oriented with steady gait at D/C.

## 2019-08-12 NOTE — Telephone Encounter
Received call from patient's wife stating that patient had fall going down about 4 steps and landed on his back early this AM. Since this time has had severe axial back pain as well as increased numbness of both feet and increased weakness of BLE. He recently underwent lumbar ESI yesterday. Discussed with patient's wife these symptoms are unlikely a result of injection and more likely related to traumatic event this AM. Discussed that if refractory to medication would be reasonable to come to ED to have thoracolumbar radiographs. Patient does have hx of vertebral compression deformities on prior imaging and he could potentially be having sudden increase in pain related to new VCF. Patient's wife demonstrated understanding.     Rosilyn Mings MD PGY-5  Interventional Pain Fellow

## 2019-08-17 ENCOUNTER — Encounter: Admit: 2019-08-17 | Discharge: 2019-08-17 | Payer: 59

## 2019-08-19 ENCOUNTER — Encounter: Admit: 2019-08-19 | Discharge: 2019-08-19 | Payer: 59

## 2019-08-30 ENCOUNTER — Encounter: Admit: 2019-08-30 | Discharge: 2019-08-30 | Payer: 59

## 2019-08-30 ENCOUNTER — Emergency Department: Admit: 2019-08-30 | Discharge: 2019-08-30 | Payer: 59

## 2019-08-30 ENCOUNTER — Emergency Department: Admit: 2019-08-30 | Discharge: 2019-08-31 | Payer: 59

## 2019-08-30 DIAGNOSIS — S22000A Wedge compression fracture of unspecified thoracic vertebra, initial encounter for closed fracture: Secondary | ICD-10-CM

## 2019-08-30 LAB — URINALYSIS MICROSCOPIC REFLEX TO CULTURE

## 2019-08-30 LAB — COMPREHENSIVE METABOLIC PANEL
Lab: 104 MMOL/L (ref 98–110)
Lab: 139 MMOL/L (ref 137–147)
Lab: 4.2 MMOL/L (ref 3.5–5.1)

## 2019-08-30 LAB — URINALYSIS DIPSTICK REFLEX TO CULTURE: Lab: NEGATIVE U/L — ABNORMAL LOW (ref 25–110)

## 2019-08-30 LAB — CBC AND DIFF: Lab: 13 10*3/uL — ABNORMAL HIGH (ref 4.5–11.0)

## 2019-08-30 LAB — POC TROPONIN: Lab: 0 ng/mL (ref 0.00–0.05)

## 2019-08-30 MED ORDER — HYDROCODONE-ACETAMINOPHEN 5-325 MG PO TAB
1 | ORAL_TABLET | ORAL | 0 refills | 15.00000 days | Status: DC | PRN
Start: 2019-08-30 — End: 2019-09-28

## 2019-08-30 MED ORDER — LIDOCAINE 5 % TP PTMD
1 | Freq: Every day | TOPICAL | 0 refills | Status: DC
Start: 2019-08-30 — End: 2019-08-31
  Administered 2019-08-30: 20:00:00 1 via TOPICAL

## 2019-08-30 MED ORDER — LORAZEPAM 2 MG/ML IJ SOLN
1 mg | Freq: Once | INTRAVENOUS | 0 refills | Status: CP
Start: 2019-08-30 — End: ?
  Administered 2019-08-30: 19:00:00 1 mg via INTRAVENOUS

## 2019-08-30 MED ORDER — METHOCARBAMOL 750 MG PO TAB
750 mg | Freq: Once | ORAL | 0 refills | Status: CP
Start: 2019-08-30 — End: ?
  Administered 2019-08-31: 01:00:00 750 mg via ORAL

## 2019-08-30 NOTE — ED Notes
Pt presents to the ED with complaint of a fall backward off a short stool onto his lower back/buttocks area. He has chronic back pain with frequent falls. He feels his falls have increased lately due to his multiple medications for his back. He adamantly refuses to have an EKG done this date for his dizziness from his medications. He states that the past couple of weeks he has felt like his back spasms have been decreasing in frequency, but with this fall today he noted that they were back in full force. He was able to get up and ambulate but not stand tall. He notes that his feet are completely numb and just feel heavy from about the ankle down. He states there is no tingling. He did not lose control of his bowel or bladder- states that has never happened to him. He is very tearful- states he has severe anxiety and needs his wife with him. He thinks he is having a panic attack because she cannot be here. He MAE, legs are weak bilaterally. Awake and alert, answers questions appropriately. Breathing is even and unlabored, skin PWD. Pt is laying in cart on left side with HOB up.       Belongings    Black boots  Overalls  Glasses  Mask  Red long sleeved shirt  Underwear  Undershirt  Wallet  Cell phone   All belongings at bedside

## 2019-08-31 NOTE — ED Notes
PT taken to MRI

## 2019-08-31 NOTE — Consults
Neurosurgery Consult History and Physical Examination    George Rodgers  Admission Date:  08/30/2019                       Assessment/Plan:  George Rodgers is a 44 y.o. 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     - No acute neurosurgical intervention  - Patient should follow up with his PCP and pain management doctor with further discussion of vertebroplasty/kyphoplasty at the levels of concern.  - Continue conservative pain management for lower back pain  - Patient is stable to discharge home.  - No need for any back brace  - Patient and plan discussed with neurosurgery attending and senior resident  - Please call with any changes in neurologic exam, questions, or concerns.    Marlynn Perking, MD  [phone]  __________________________________________________________________________________    Chief Complaint:  Acute T7 and T11 compression deformities    History of Present Illness:   George Rodgers is a 44 y.o. male with a PMH of chronic LBP with prior known T8 and T10 compression fractures who presents to [facility] after a ground level fall found to have acute T7 and T11 compression deformities.  The patient provides the history.  He walked into the ED.  He states that he fell back off of a stool onto his bottom earlier today for unknown reasons.  He has since had back spasms.  He denies any other new symptoms that he did not already have.  His right sided radicular pain is stable.  He denies any bladder or bowel incontinence.  He states he always has intermittent numbness/tingling in his feet from his known neuropathies.  An MRI of the T spine demonstrates acute T7 and T11 anterior compression deformities with 30-40% loss of height and no significant retropulsion or angulation.    Past Medical History:  Medical History:   Diagnosis Date   ? Anxiety disorder from patient questionnaire   ? Low back pain        Past Surgical History:  Surgical History:   Procedure Laterality Date   ? HX TONSILLECTOMY  1982       Social History:  Social History     Tobacco Use   ? Smoking status: Never Smoker   ? Smokeless tobacco: Never Used   Substance Use Topics   ? Alcohol use: Yes     Comment: 3 per week   ? Drug use: Never       Family History:  NO pertinent family history    Allergies:    Patient has no known allergies.    Medications:  (Not in a hospital admission)      Review of Systems:   Full 10 point review of systems negative except for HPI    Physical Exam:  Vital Signs: Last Filed In 24 Hours Vital Signs: 24 Hour Range   BP: 145/89 (01/26 2200)  Temp: 36.9 ?C (98.4 ?F) (01/26 1023)  Pulse: 105 (01/26 1023)  Respirations: 24 PER MINUTE (01/26 1023)  SpO2: 97 % (01/26 2200)  SpO2 Pulse: 80 (01/26 2200) BP: (145-168)/(89-96)   Temp:  [36.9 ?C (98.4 ?F)]   Pulse:  [105]   Respirations:  [24 PER MINUTE]   SpO2:  [95 %-100 %]    Intensity Pain Scale (Self Report):  10 (08/30/19 1024)      General appearance: No acute distress  Lungs: Clear to auscultation bilaterally  Heart: Regular rate and rhythm  Gastrointestinal: Soft, non-tender. Bowel sounds normal. no masses,  no organomegaly  Musculoskeletal: No edema, redness or tenderness in the calves or thighs  Skin: Integument intact without major lesion.  Psychiatric: Normal affect  Patient denies any significant pain to palpation along the midline of his back or off midline.    Neurologic Exam:   Mental Status: Awake, alert and oriented x 4, fluent speech, normal cognition  Pupils: Pupils equal round and reactive to light  Cranial Nerves: CN II-XII individually tested and found to be intact, gag not tested  Motor:   AA EF EE WF WE FF FE FA G HF KF KE DF PF EHL   Left 5 5 5 5 5 5 5 5 5 5 5 5 5 5 5    Right 5 5 5 5 5 5 5 5 5 5 5 5 5 5 5    Normal muscle bulk and tone  Sensation: Sensation intact to light touch throughout Gait: deferred  Rectal: Not assessed    Lab Tests:  Hematology:    Lab Results   Component Value Date    HGB 14.8 08/30/2019    HCT 43.4 08/30/2019    PLTCT 308 08/30/2019    WBC 13.9 08/30/2019    NEUT 78 08/30/2019    ANC 10.94 08/30/2019    ALC 1.88 08/30/2019    MONA 5 08/30/2019    AMC 0.74 08/30/2019    ABC 0.16 08/30/2019    MCV 82.1 08/30/2019    MCHC 34.0 08/30/2019    MPV 8.3 08/30/2019    RDW 13.7 08/30/2019     General Chemistry:   Lab Results   Component Value Date    NA 139 08/30/2019    K 4.2 08/30/2019    CL 104 08/30/2019    CO2 28 08/30/2019    BUN 14 08/30/2019    CR 0.92 08/30/2019    GLU 99 08/30/2019    CA 9.2 08/30/2019     General Chemistry:   Lab Results   Component Value Date    GAP 7 08/30/2019    ALBUMIN 4.2 08/30/2019    TOTBILI 0.5 08/30/2019    TOTPROT 7.1 08/30/2019    AST 15 08/30/2019    ALT 13 08/30/2019    ALKPHOS 101 08/30/2019       Radiology and other Diagnostics Review:    Pertinent imaging reviewed  Thoracic spine:     1. ?Acute-appearing compression deformities involving the T7 and T11   vertebral bodies with 30-40% height loss and diffuse marrow edema.   Subacute to chronic appearing T8-T10 anterior wedging compression fracture   deformities.   2. ?No retropulsion, traumatic subluxation, or intraspinal hemorrhage.   3. ?Mild thoracic spondylosis resulting in multilevel mild neural   foraminal stenosis without significant central spinal stenosis.     Marlynn Perking, MD  [phone]

## 2019-08-31 NOTE — ED Notes
Dr. Martie Round spoke to patient re: tests , patient insisting on going home. States, will follow up with neurosurgery . Patient has wife here and will assist with drive home. Ambulatory.

## 2019-08-31 NOTE — ED Notes
Pt changed into a hospital gown to prepare for MRI

## 2019-09-08 ENCOUNTER — Encounter: Admit: 2019-09-08 | Discharge: 2019-09-08 | Payer: 59

## 2019-09-08 ENCOUNTER — Ambulatory Visit: Admit: 2019-09-08 | Discharge: 2019-09-09 | Payer: 59

## 2019-09-08 DIAGNOSIS — F419 Anxiety disorder, unspecified: Secondary | ICD-10-CM

## 2019-09-08 DIAGNOSIS — M47819 Spondylosis without myelopathy or radiculopathy, site unspecified: Secondary | ICD-10-CM

## 2019-09-08 DIAGNOSIS — M7918 Myalgia, other site: Secondary | ICD-10-CM

## 2019-09-08 DIAGNOSIS — M5137 Other intervertebral disc degeneration, lumbosacral region: Secondary | ICD-10-CM

## 2019-09-08 DIAGNOSIS — M545 Low back pain: Secondary | ICD-10-CM

## 2019-09-08 DIAGNOSIS — S22000A Wedge compression fracture of unspecified thoracic vertebra, initial encounter for closed fracture: Secondary | ICD-10-CM

## 2019-09-08 DIAGNOSIS — M5416 Radiculopathy, lumbar region: Secondary | ICD-10-CM

## 2019-09-13 ENCOUNTER — Encounter: Admit: 2019-09-13 | Discharge: 2019-09-13 | Payer: 59

## 2019-09-13 NOTE — Patient Instructions
Thank you for choosing to visit our clinic today. Please do not hesitate to call or send us a message on MyChart if you have any questions about your plan of care.    Keivon Garden, BSN, RN  Ambulatory Clinic Registered Nurse  Orthopedic Surgery  Larry Cordell, MD and Alysse Doane, FNP-BC  [facility]  Marc A. Asher, MD [facility]  Phone: [phone] / Fax: [phone]

## 2019-09-13 NOTE — Telephone Encounter
LVM for patient regarding upcoming appt.

## 2019-09-14 ENCOUNTER — Encounter: Admit: 2019-09-14 | Discharge: 2019-09-14 | Payer: 59

## 2019-09-14 ENCOUNTER — Ambulatory Visit: Admit: 2019-09-14 | Discharge: 2019-09-14 | Payer: 59

## 2019-09-14 DIAGNOSIS — M545 Low back pain: Secondary | ICD-10-CM

## 2019-09-14 DIAGNOSIS — S22000A Wedge compression fracture of unspecified thoracic vertebra, initial encounter for closed fracture: Secondary | ICD-10-CM

## 2019-09-14 DIAGNOSIS — M5136 Other intervertebral disc degeneration, lumbar region: Secondary | ICD-10-CM

## 2019-09-14 DIAGNOSIS — F419 Anxiety disorder, unspecified: Secondary | ICD-10-CM

## 2019-09-14 NOTE — Progress Notes
Patient BP 171/89, HR 96. Not taking any BP medications. Patient states he has been having some blurred vision and headaches. States he takes his BP at home regularly and systolic is normally around 130. Per patient "the only reason my blood pressure is hi right now is because I'm in so much pain"  RN notified Dr. Suan Halter.

## 2019-09-14 NOTE — Progress Notes
[facility] HISTORY [facility] PHYSICAL    Chief Complaint   Patient presents with   ? Lower Back - Pain   ? Right Leg - Numbness   ? Left Leg - Numbness   ? New Patient                 Vitals:    09/14/19 1329   BP: (!) 171/89   Pulse: 96   Resp: 18   Temp: 36.6 ?C (97.8 ?F)   SpO2: 98%   Weight: (!) 184.3 kg (406 lb 6.4 oz)   Height: 182.9 cm (72)   PainSc: Eight        Medical History:   Diagnosis Date   ? Anxiety disorder     from patient questionnaire   ? Low back pain          Surgical History:   Procedure Laterality Date   ? HX TONSILLECTOMY  1982         No Known Allergies      Current Outpatient Medications on File Prior to Visit   Medication Sig Dispense Refill   ? baclofen(#) (LIORESAL) 10 mg/mL 10 mg three times daily.     ? buspirone HCl (BUSPAR PO) Take  by mouth twice daily.     ? Calcium-Cholecalciferol (D3) (CALCIUM 600 WITH VITAMIN D3) 600 mg(1,500mg ) -400 unit cap      ? duloxetine HCl (DULOXETINE PO) Take  by mouth at bedtime daily.     ? HYDROcodone/acetaminophen (NORCO) 5/325 mg tablet Take one tablet by mouth every 4 hours as needed for Pain for up to 10 doses 15 tablet 0   ? HYDROcodone/acetaminophen (NORCO) 5/325 mg tablet Take one tablet to two tablets by mouth every 4 hours as needed for Pain 20 tablet 0   ? MELOXICAM PO twice daily.     ? oxyCODONE/acetaminophen (PERCOCET) 10/325 mg tablet      ? pregabalin (LYRICA) 150 mg capsule Take one capsule by mouth three times daily. 90 capsule 2     Current Facility-Administered Medications on File Prior to Visit   Medication Dose Route Frequency Provider Last Rate Last Admin   ? fentaNYL citrate PF (SUBLIMAZE) injection 50-100 mcg  50-100 mcg Intravenous Q2 MIN PRN Evelina Bucy, MD   100 mcg at 04/13/18 0917   ? midazolam (VERSED) injection 1-2 mg  1-2 mg Intravenous Q2 MIN PRN Evelina Bucy, MD   2 mg at 04/13/18 1610 family history includes Arthritis in his mother; Back pain in his mother; Diabetes in his father; Heart problem in his father; Hypertension in his father and mother; Joint Pain in his mother.      Social History     Socioeconomic History   ? Marital status: Married     Spouse name: Not on file   ? Number of children: Not on file   ? Years of education: Not on file   ? Highest education level: Not on file   Occupational History   ? Not on file   Tobacco Use   ? Smoking status: Never Smoker   ? Smokeless tobacco: Never Used   Substance and Sexual Activity   ? Alcohol use: Yes     Alcohol/week: 2.0 standard drinks     Types: 2 Cans of beer per week     Comment: 3 per week   ? Drug use: Never   ? Sexual activity: Yes     Partners: Female     Birth control/protection:  None   Other Topics Concern   ? Not on file   Social History Narrative   ? Not on file         Review of Systems   Constitutional: Positive for activity change and fatigue.   HENT: Positive for ear pain.    Eyes: Negative.    Respiratory: Negative.    Cardiovascular: Negative.    Gastrointestinal: Negative.    Endocrine: Negative.    Genitourinary: Positive for urgency.   Musculoskeletal: Positive for back pain and neck pain.   Skin: Negative.    Allergic/Immunologic: Negative.    Neurological: Positive for dizziness, light-headedness, numbness and headaches.   Psychiatric/Behavioral: Positive for agitation. The patient is nervous/anxious.           HPI / PHYSICAL EXAM / RADIOGRAPHIC EVALUATION /  IMPRESSION / PLAN      Dictation on: 09/14/2019  3:44 PM by: Wyona Almas [LCORDELL]                Total time 90 minutes.

## 2019-09-16 ENCOUNTER — Encounter: Admit: 2019-09-16 | Discharge: 2019-09-16 | Payer: 59

## 2019-09-16 ENCOUNTER — Ambulatory Visit: Admit: 2019-09-16 | Discharge: 2019-09-16 | Payer: 59

## 2019-09-16 DIAGNOSIS — F419 Anxiety disorder, unspecified: Secondary | ICD-10-CM

## 2019-09-16 DIAGNOSIS — M546 Pain in thoracic spine: Secondary | ICD-10-CM

## 2019-09-16 DIAGNOSIS — M545 Low back pain: Secondary | ICD-10-CM

## 2019-09-16 DIAGNOSIS — S22000A Wedge compression fracture of unspecified thoracic vertebra, initial encounter for closed fracture: Secondary | ICD-10-CM

## 2019-09-16 NOTE — Patient Instructions
It was nice to see you today. Thank you for choosing to visit our clinic.       Your time is important and if you had to wait today, we do apologize. Our goal is to run exactly on time; however, on occasion, we get behind in clinic due to unexpected patient issues. Thank you for your patience.    General Instructions:   How to reach me: Please send a MyChart message to the [facility] or leave a voicemail for my nurse, Melissa, at [phone]   How to get a medication refill: Please use the MyChart Refill request or contact your pharmacy directly to request medication refills. We do not do same day refills on controlled substances.   How to receive your test results: If you have signed up for MyChart, you will receive your test results and messages from me this way. Otherwise, you will get a phone call or letter. If you are expecting results and have not heard from my office within 2 weeks of your testing, please send a MyChart message or call my office.   Scheduling: Our scheduling phone number is [phone].   Appointment Reminders on your cell phone: Make sure we have your cell phone number, and Text [place] to [phone].   Support for many chronic illnesses is available through [facility]: [facility] or [phone].   For questions on nights, weekends or holidays, call the Operator at [phone], and ask for the doctor on call for Anesthesia Pain.      Again, thank you for coming in today.    _________________________________________________________________________________________________________

## 2019-09-16 NOTE — Telephone Encounter
Unable to reach pt to confirm appt. LVM

## 2019-09-19 ENCOUNTER — Encounter: Admit: 2019-09-19 | Discharge: 2019-09-19 | Payer: 59

## 2019-09-19 NOTE — Telephone Encounter
Wife calling in stating that patient would like to proceed with Kyphoplasty with private insurance as they are withdrawing work comp claim. Instructed wife that in notes Dr. Fredna Dow had stated he wanted to give some time for conservative treatment but I would route to him to see if he is ok with proceeding now.

## 2019-09-20 ENCOUNTER — Encounter: Admit: 2019-09-20 | Discharge: 2019-09-20 | Payer: 59

## 2019-09-20 ENCOUNTER — Ambulatory Visit: Admit: 2019-09-20 | Discharge: 2019-09-20 | Payer: 59

## 2019-09-20 DIAGNOSIS — M545 Low back pain: Secondary | ICD-10-CM

## 2019-09-20 DIAGNOSIS — F419 Anxiety disorder, unspecified: Secondary | ICD-10-CM

## 2019-09-20 DIAGNOSIS — M5416 Radiculopathy, lumbar region: Secondary | ICD-10-CM

## 2019-09-20 DIAGNOSIS — M5137 Other intervertebral disc degeneration, lumbosacral region: Secondary | ICD-10-CM

## 2019-09-20 MED ORDER — TRIAMCINOLONE ACETONIDE 40 MG/ML IJ SUSP
80 mg | Freq: Once | EPIDURAL | 0 refills | Status: CP
Start: 2019-09-20 — End: ?

## 2019-09-20 MED ORDER — IOHEXOL 300 MG IODINE/ML IV SOLN
2 mL | Freq: Once | 0 refills | Status: CP
Start: 2019-09-20 — End: ?

## 2019-09-20 MED ORDER — LIDOCAINE (PF) 10 MG/ML (1 %) IJ SOLN
2 mL | Freq: Once | INTRAMUSCULAR | 0 refills | Status: CP
Start: 2019-09-20 — End: ?

## 2019-09-20 NOTE — Discharge Instructions - Supplementary Instructions
GENERAL POST PROCEDURE INSTRUCTIONS  Physician: _________________________________  Procedure Completed Today:  o Joint Injection (hip, knee, shoulder)  o Cervical Epidural Steroid Injection  o Cervical Transforaminal Steroid Injection  o Trigger Point Injection  o Caudal Epidural Steroid Injection  o Pudendal Nerve Block  o Other _____________________ o Thoracic Epidural Steroid Injection  o Lumbar Epidural Steroid Injection  o Lumbar Transforaminal Steroid Injection  o Facet Joint Injection  o Celiac Nerve Block  o Sacrococcygeal  o Sacroiliac Joint Injection   Important information following your procedure today:  o You may drive today     o If you had sedation, you may NOT drive today  ? Rest at home for the next 6 hours.  You may then begin to resume your normal activities.  ? DO NOT drive any vehicle, operate any power tools, drink alcohol, make any major decisions, or sign any legal documents for the next 12 hours.  1. Pain relief may not be immediate. It is possible you may even experience an increase in pain during the first 24-48 hours followed by a gradual decrease of your pain.  2. Though the procedure is generally safe and complications are rare, we do ask that you be aware of any of the following:  ? Any swelling, persistent redness, new bleeding or drainage from the site of the injection.  ? You should not experience a severe headache.  ? You should not run a fever over 101oF.  ? New onset of sharp, severe back and or neck pain.  ? New onset of upper or lower extremity numbness or weakness.  ? New difficulty controlling bowel or bladder function after injection.  ? New shortness of breath.  If any of these occur, please call to report this occurrence to a nurse at [phone]. If you are calling after 4:00pm, on the weekends or holidays please call [phone] and ask to have the pain management resident physician on call for the physician paged or go to your local emergency room.   3. You may experience soreness at the injection site. Ice can be applied at 20 minute intervals for the first 24 hours. The following day you may alternate ice with heat if you are experiencing muscle tightness, otherwise continue with ice. Ice works best at decreasing pain. Avoid application of direct heat, hot showers or hot tubs today.  4. Avoid strenuous activity today. You many resume your regular activities and exercise tomorrow.  5. Patients with diabetes may see an elevation in blood sugars for 7-10 days after the injection. It is important to pay close attention to your diet, check your blood sugars daily and report extreme elevations to the physician that manages your diabetes.  6. Patients taking daily blood thinners can resume their regular dose this evening.  7. It is important that you take all medications ordered by your pain physician. Taking medications as ordered is an important part of you pain care plan. If you cannot continue the medication plan, please notify the physician.    Possible side effects to steroids that may occur:  ? Flushing or redness of the face  ? Irritability  ? Fluid retention  ? Change in women's menses    Follow up appointment as needed if in the event you are unable to keep an appointment please notify the scheduler 24 hours in advance at [phone]. If you have questions for the surgery center, call [facility] at [phone].

## 2019-09-20 NOTE — Procedures
Attending Surgeon: Evelina Bucy, MD    Anesthesia: Local    Pre-Procedure Diagnosis:   1. Lumbar radiculopathy    2. DDD (degenerative disc disease), lumbosacral        Post-Procedure Diagnosis:   1. Lumbar radiculopathy    2. DDD (degenerative disc disease), lumbosacral        ESI LMBR/SAC Injection  Procedure: epidural - interlaminar    Laterality: n/a    Location: lumbar ESI with imaging - L4-5      Consent:   Consent obtained: written  Consent given by: patient  Risks discussed: allergic reaction, bleeding, bruising, infection, nerve damage, no change or worsening in pain, reaction to medication, seizure, swelling and weakness  Alternatives discussed: alternative treatment, delayed treatment and no treatment  Discussed with patient the purpose of the treatment/procedure, other ways of treating my condition, including no treatment/ procedure and the risks and benefits of the alternatives. Patient has decided to proceed with treatment/procedure.        Universal Protocol:  Relevant documents: relevant documents present and verified  Site marked: the operative site was marked  Patient identity confirmed: Patient identify confirmed verbally with patient.        Time out: Immediately prior to procedure a time out was called to verify the correct patient, procedure, equipment, support staff and site/side marked as required      Procedures Details:   Indications: pain   Prep: chlorhexidine  Patient position: prone  Estimated Blood Loss: minimal  Specimens: none  Amount Injected:   L4-5: 4mL    Number of Levels: 1  Guidance: fluoroscopy  Needle and Epidural Catheter: tuohy  Needle size: 18 G  Injection procedure: Incremental injection and Negative aspiration for blood  Patient tolerance: Patient tolerated the procedure well with no immediate complications. Pressure was applied, and hemostasis was accomplished.  Outcome: Pain relieved  Comments:   LUMBAR INTERLAMINAR EPIDURAL STEROID INJECTION    PROCEDURE:  1) L4-5 interlaminar epidural steroid injection  2) Fluoroscopic needle guidance    REASON FOR PROCEDURE: Lumbar radiculopathy, DDD (lumbar)    PHYSICIAN: Evelina Bucy, MD    MEDICATIONS INJECTED: 2 mL of triamciniolone (80 mg) and 2 mL of sterile, preservative-free saline    LOCAL ANESTHETIC INJECTED: 1 mL of 1% lidocaine    SEDATION MEDICATIONS: None    SPECIMENS REMOVED: None    ESTIMATED BLOOD LOSS: None    COMPLICATIONS: None    TECHNIQUE: Time-out was taken to identify the correct patient, procedure and side prior to starting the procedure. With the patient lying in the prone position, the area was prepped and draped in the usual sterile fashion using DuraPrep and a fenestrated drape. The level above was determined using fluoroscopy. A local anesthetic was given using a 27-gauge 1.25-inch needle by raising a skin wheal and going down to the hub. A 3.5-inch 18-gauge Tuohy needle was introduced under intermittent fluoroscopic guidance. The needle was advanced through the ligamentum flavum using the loss of resistance technique. Once the tip of the needle was thought to be in the desired position, the contrast was injected to confirm only epidural spread, and no vascular runoff. The injectate was then injected slowly with intermittent negative aspiration. The procedure was completed without complications and was tolerated well. The patient was monitored after the procedure. The patient (or responsible party) was given post-procedure and discharge instructions to follow at home. The patient was discharged in stable condition.     Evelina Bucy, MD, MBA  Estimated blood loss: none or minimal  Specimens: none  Patient tolerated the procedure well with no immediate complications. Pressure was applied, and hemostasis was accomplished.

## 2019-09-20 NOTE — Progress Notes
I have examined the patient, and there are no significant changes in their condition, from the previous H&P performed on 09/08/19.     L4-5 ILESI        Obtained patient's verbal consent to treat them and their agreement to [facility] financial policy and NPP via this telehealth visit during the [facility]    [facility] - Interventional Pain      Subjective     Chief Complaint: LBP    Interval HPI: Morgon Pamer is a 44 y.o. male who  has a past medical history of Anxiety disorder and Low back pain. who now presents for evaluation.    Patient reports recent fall while at work, patient states that he believes he has syncopal episode. Patient states he has followed up with PCP and recently diagnosed with inner infection.     Patient reports since his recent falls his chronic back pain has been worse, yet he has noted tailbone pain since the fall.      Patient reports minimal relief after L4-L5 ESI, yet pain states he has a fall about 2 days after the injection.     The pain is in the middle of mid back, then will radiate to the low back, will radiate to buttock and tailbone regions and lateral hips and then radiates in lateral distribution to the knees bilaterally, R>L   Will occasionally pain wrap subcostal region bilaterally   Pain constant  Numbness/tingling: yes - top of the feet bilaterally, intermittent   The pain ranges 5-10/10  The pain is described as ache, throbbing, burning, tightening   The pain is exacerbated by twisting, standing, prolonged walking, bending  The pain is partially alleviated by lying down, ice/heat, prescribed medications  +muscle stiffness/tightness  +sleep distrubance  Reports weakness in BLE, intermittent   ?  Currently taking Lyrica TID, baclofen BID, and Mobic BID. Patient reports mild relief of pain with medication use and denies SEs from medication use.     ?  Hx of spine surgery: none   ?  Patient denies fevers, chills, infection, bleeding issues, anticoagulants, new muscle weakness, numbness, tingling, bowel/bladder incontinence, or saddle anesthesia.         ROS: All 14 systems reviewed and found to be negative except as above and as follows.  Review of Systems   Constitutional: Positive for appetite change and fatigue.   HENT: Negative.    Eyes: Negative.    Respiratory: Negative.    Cardiovascular: Negative.    Gastrointestinal: Negative.    Endocrine: Negative.    Genitourinary: Negative.    Musculoskeletal: Positive for arthralgias, back pain, myalgias and neck pain.   Skin: Negative.    Allergic/Immunologic: Negative.    Neurological: Positive for dizziness, light-headedness, numbness and headaches.   Hematological: Negative.    Psychiatric/Behavioral: Positive for sleep disturbance.   All other systems reviewed and are negative.       INITIAL HPI:  The pain is in the   Lower back and left lower extremity    Pain started:    1 year ago    Initial inciting injury or event:    Bending over 1 year ago with further exacerbation following fall in January 2019    Numbness/tingling:    Yes, over left anterior thigh, shin, top of foot into toes    The pain averages    8/10    The pain is described as   Severe aching and sharp pain  in the lower thoracic back and lumbar region with burning pain over the left thigh, anterior shin, dorsum of foot    The pain is exacerbated by      Activity    The pain is partially alleviated by      Percocet 10/325    PRIOR MEDICATIONS:   Effective  Percocet 10/325 on average two per week for flares (Minimal effectiveness, dislikes disorientation and sleepiness while on this medication)    Ineffective  Physical therapy for one month  Gabapentin 600 mg PO TID (Currently taking)  Baclofen 10 mg PO TID (Currently taking)  Meloxicam 10 mg PO BID (Currently taking)  Tylenol PM at Night (Currently taking)  Flexeril  Robaxin   Cymbalta    Unable to tolerate  None    Never  Lyrica  Ami/Nortriptyline  Tizanidine    PRIOR INTERVENTIONS:   Effective  Bilateral T7-T9 RFA  Right L4-S1 TFESI (transient)    Ineffective  Trigger point injections 2019 (Thoracic Spine)    Marinus Maw denies any recent fevers, chills, infection, antibiotics, bowel or bladder incontinence, saddle anesthesia, bleeding issues, or recent anticoagulant.       Past Medical History:  Medical History:   Diagnosis Date   ? Anxiety disorder     from patient questionnaire   ? Low back pain      Family History:  Family History   Problem Relation Age of Onset   ? Arthritis Mother    ? Back pain Mother    ? Hypertension Mother    ? Joint Pain Mother    ? Diabetes Father    ? Heart problem Father    ? Hypertension Father        Social History:  Lives in [place] [id number]    Social History     Socioeconomic History   ? Marital status: Married     Spouse name: Not on file   ? Number of children: Not on file   ? Years of education: Not on file   ? Highest education level: Not on file   Occupational History   ? Not on file   Tobacco Use   ? Smoking status: Never Smoker   ? Smokeless tobacco: Never Used   Substance and Sexual Activity   ? Alcohol use: Yes     Alcohol/week: 2.0 standard drinks     Types: 2 Cans of beer per week     Comment: 3 per week   ? Drug use: Never   ? Sexual activity: Yes     Partners: Female     Birth control/protection: None   Other Topics Concern   ? Not on file   Social History Narrative   ? Not on file       Allergies:  No Known Allergies    Medications:    Current Outpatient Medications:   ?  baclofen(#) (LIORESAL) 10 mg/mL, 10 mg three times daily., Disp: , Rfl:   ?  buspirone HCl (BUSPAR PO), Take  by mouth twice daily., Disp: , Rfl:   ?  Calcium-Cholecalciferol (D3) (CALCIUM 600 WITH VITAMIN D3) 600 mg(1,500mg ) -400 unit cap, , Disp: , Rfl:   ?  duloxetine HCl (DULOXETINE PO), Take  by mouth at bedtime daily., Disp: , Rfl:   ?  HYDROcodone/acetaminophen (NORCO) 5/325 mg tablet, Take one tablet by mouth every 4 hours as needed for Pain for up to 10 doses, Disp: 15 tablet, Rfl: 0  ?  HYDROcodone/acetaminophen (  NORCO) 5/325 mg tablet, Take one tablet to two tablets by mouth every 4 hours as needed for Pain, Disp: 20 tablet, Rfl: 0  ?  MELOXICAM PO, twice daily., Disp: , Rfl:   ?  oxyCODONE/acetaminophen (PERCOCET) 10/325 mg tablet, , Disp: , Rfl:   ?  pregabalin (LYRICA) 150 mg capsule, Take one capsule by mouth three times daily., Disp: 90 capsule, Rfl: 2    Current Facility-Administered Medications:   ?  fentaNYL citrate PF (SUBLIMAZE) injection 50-100 mcg, 50-100 mcg, Intravenous, Q2 MIN PRN, Evelina Bucy, MD, 100 mcg at 04/13/18 0917  ?  iohexoL (OMNIPAQUE-300) 300 mg/mL injection 2 mL, 2 mL, SEE ADMIN INSTRUCTIONS, ONCE, Jomarie Gellis, MD  ?  lidocaine PF 1% (10 mg/mL) injection 2 mL, 2 mL, Injection, ONCE, Maribeth Jiles, MD  ?  midazolam (VERSED) injection 1-2 mg, 1-2 mg, Intravenous, Q2 MIN PRN, Lourdes Sledge, Rocsi Hazelbaker, MD, 2 mg at 04/13/18 0917  ?  triamcinolone acetonide (KENALOG-40) injection 80 mg, 80 mg, Epidural, ONCE, Melanie Pellot, MD    Physical examination:   BP (!) 149/95 (BP Source: Arm, Right Upper)  - Pulse 117  - Temp 36.7 ?C (98.1 ?F)  - Ht 188 cm (74)  - Wt (!) 186.9 kg (412 lb)  - SpO2 97%  - BMI 52.90 kg/m?        Exam performed by instructing patient to perform various maneuvers and provide feedback while I personally visualized the patient exam.     General: The patient is a well-developed, well nourished 44 y.o. male in no acute distress.   HEENT: Head is normocephalic and atraumatic.   Pulmonary: The patient has unlabored respirations and bilateral symmetric chest excursion.   Abdomen: Soft, nontender, and obese. There is no rebound or guarding per patient assessment.   Extremities: No clubbing, cyanosis, or edema as seen via video.     Neurologic:   The patient is alert and oriented times 3.     Musculoskeletal:   Gait is antalgic.     T/L-Spine   There is mild point vertebral tenderness noted around the low thoracic and upper lumbar region.   There is moderate-severe low thoracic and low lumbar paraspinal tenderness.  Facet loading is positive.   ROM with flexion, extension, rotation, and lateral bending is intact with pain limitation  Strength is equal and adequate bilaterally in the flexors and extensors of the bilateral lower extremities.         MRI     Last Cr and LFT's:  Creatinine   Date Value Ref Range Status   08/30/2019 0.92 0.4 - 1.24 MG/DL Final     AST (SGOT)   Date Value Ref Range Status   08/30/2019 15 7 - 40 U/L Final     ALT (SGPT)   Date Value Ref Range Status   08/30/2019 13 7 - 56 U/L Final     Alk Phosphatase   Date Value Ref Range Status   08/30/2019 101 25 - 110 U/L Final     Total Bilirubin   Date Value Ref Range Status   08/30/2019 0.5 0.3 - 1.2 MG/DL Final       1/61/0960 Scoliosis AP/LAT  SCOLIOSIS AP/LAT    Clinical Indication: Lumbar/Back pain.    Comparison: None    IMPRESSION  Findings/Impression:    1. ?Limited detail due to patient's body habitus.    2. ?No evidence of scoliosis. Normal alignment of the visible spine though   cannot evaluate the caudal half  of the cervical spine and the upper   portion of the thoracic spine on the lateral view due to overlying soft   tissues. Markedly limited evaluation for lumbar spine at L5-S1 due to   overlying soft tissues.    3. ?Likely chronic anterior wedge compression deformity of the T9   vertebral body. Other visible vertebra are grossly unremarkable though   there is limited detail.       ?Finalized by Yvonna Alanis, M.D. on 01/29/2018 8:40 AM. Dictated by Yvonna Alanis, M.D. on 01/29/2018 8:38 AM.       Assessment:    Caspen Panciera is a 44 y.o. male who  has a past medical history of Anxiety disorder and Low back pain. who presents for evaluation of pain.    The pain complaints are most likely due to:    1. Lumbar radiculopathy  [place] [facility] INJECT INTERLAM LMBR/SAC    [place] [facility] SPINE INJECT INTERLAM LMBR/SAC    lidocaine PF 1% (10 mg/mL) injection 2 mL iohexoL (OMNIPAQUE-300) 300 mg/mL injection 2 mL    triamcinolone acetonide (KENALOG-40) injection 80 mg   2. DDD (degenerative disc disease), lumbosacral  [place] [facility] SP[facility] INJECT INTERLAM LMBR/SAC    [place] [facility] SPINE INJECT INTERLAM LMBR/SAC    lidocaine PF 1% (10 mg/mL) injection 2 mL    iohexoL (OMNIPAQUE-300) 300 mg/mL injection 2 mL    triamcinolone acetonide (KENALOG-40) injection 80 mg       Patient has had an adequate trial of > 12 month of rest, exercise, multimodal treatment, and the passage of time without improvement of symptoms. The pain has significant impact on the daily quality of life.     Plan:  1. Discussed plan of care options with patient. Will schedule for L4-L5 ILESI at next available appointment.  2. Per patient request, will refer to [facility] ortho surgery for low back pain. Discussed with patient will need to continue on weight reduction goals, patient verbalized understanding.  3. Will continue Lyrica as prescribed.   4. Discussed additional options of possible HF10 SCS trial/implant, patient wishes to determine surgical options first.    5. Discussed intermittent FMLA paperwork, discussed with patient we do not provide chronic or long-term FMLA/disability paperwork, if patient wishes increased time he will need to discuss with PCP. Additionally encouraged patient to continue with work comp after recent falls.   6. Follow-up after procedure.     Risks/benefits of all pharmacologic and interventional treatments discussed and questions answered.     ADDENDUM:  APRN discussed patient case with Dr. Fredna Dow, will refer for possible kyphoplasty (T7 and/or T11).    Todays visit took place via face-to-face encounter utilizing Zoom application. Visit Start Time 1408 Visit End Time 1444.  Today's visit was prolonged due to additional time spent interventional options and SCS therapy.

## 2019-09-22 ENCOUNTER — Encounter: Admit: 2019-09-22 | Discharge: 2019-09-22 | Payer: 59

## 2019-09-23 ENCOUNTER — Encounter: Admit: 2019-09-23 | Discharge: 2019-09-23 | Payer: 59

## 2019-09-26 ENCOUNTER — Encounter: Admit: 2019-09-26 | Discharge: 2019-09-26 | Payer: 59

## 2019-09-26 NOTE — Telephone Encounter
Called patient to inform him that Kyphoplasty scheduled at [facility] was not possible as that location can't do these procedures. Next available appointment 3rd week in March. Patient stated that he is out of work until then and can't be off work for that long. Instructed patient I would get with Dr. Fredna Dow and see if there are any other options available. Per Dr. Fredna Dow add to this Wednesday and verify with procedures that it is ok. Verified with Procedure Supervisor Raynelle Fanning ok to add to Wednesday at 12:45.     LVM with patient with new appointment date/time and instructed him to please call back to verify he received message.     Updtated Email to vendor sent

## 2019-09-27 ENCOUNTER — Encounter: Admit: 2019-09-27 | Discharge: 2019-09-27 | Payer: 59

## 2019-09-27 DIAGNOSIS — S22000A Wedge compression fracture of unspecified thoracic vertebra, initial encounter for closed fracture: Secondary | ICD-10-CM

## 2019-09-27 DIAGNOSIS — M546 Pain in thoracic spine: Secondary | ICD-10-CM

## 2019-09-27 NOTE — Telephone Encounter
Patient wife calling to confirm the appointment on Wed 24.2021.

## 2019-09-28 ENCOUNTER — Encounter: Admit: 2019-09-28 | Discharge: 2019-09-28 | Payer: 59

## 2019-09-28 ENCOUNTER — Ambulatory Visit: Admit: 2019-09-28 | Discharge: 2019-09-28 | Payer: 59

## 2019-09-28 DIAGNOSIS — S22000A Wedge compression fracture of unspecified thoracic vertebra, initial encounter for closed fracture: Secondary | ICD-10-CM

## 2019-09-28 DIAGNOSIS — M546 Pain in thoracic spine: Secondary | ICD-10-CM

## 2019-09-28 MED ORDER — FENTANYL CITRATE (PF) 50 MCG/ML IJ SOLN
50-100 ug | INTRAVENOUS | 0 refills | Status: DC | PRN
Start: 2019-09-28 — End: 2019-09-29
  Administered 2019-09-28 (×2): 50 ug via INTRAVENOUS
  Administered 2019-09-28: 20:00:00 100 ug via INTRAVENOUS
  Administered 2019-09-28 (×2): 50 ug via INTRAVENOUS

## 2019-09-28 MED ORDER — IOPAMIDOL 41 % IT SOLN
30 mL | Freq: Once | EPIDURAL | 0 refills | Status: CP
Start: 2019-09-28 — End: ?
  Administered 2019-09-28: 20:00:00 30 mL via EPIDURAL

## 2019-09-28 MED ORDER — MIDAZOLAM 1 MG/ML IJ SOLN
1-2 mg | INTRAVENOUS | 0 refills | Status: DC | PRN
Start: 2019-09-28 — End: 2019-09-29
  Administered 2019-09-28: 21:00:00 1 mg via INTRAVENOUS
  Administered 2019-09-28: 20:00:00 2 mg via INTRAVENOUS
  Administered 2019-09-28 (×2): 1 mg via INTRAVENOUS

## 2019-09-28 MED ORDER — CEFAZOLIN INJ 1GM IVP
1 g | Freq: Once | INTRAVENOUS | 0 refills | Status: CP
Start: 2019-09-28 — End: ?
  Administered 2019-09-28: 20:00:00 1 g via INTRAVENOUS

## 2019-09-28 MED ORDER — HYDROCODONE-ACETAMINOPHEN 5-325 MG PO TAB
1 | ORAL_TABLET | ORAL | 0 refills | 15.00000 days | Status: AC | PRN
Start: 2019-09-28 — End: ?

## 2019-09-28 MED ORDER — CEFAZOLIN IVPB
3 g | Freq: Once | INTRAVENOUS | 0 refills | Status: DC
Start: 2019-09-28 — End: 2019-09-28

## 2019-09-28 MED ORDER — CEFAZOLIN INJ 1GM IVP
2 g | Freq: Once | INTRAVENOUS | 0 refills | Status: CP
Start: 2019-09-28 — End: ?
  Administered 2019-09-28: 20:00:00 2 g via INTRAVENOUS

## 2019-10-10 ENCOUNTER — Encounter: Admit: 2019-10-10 | Discharge: 2019-10-10 | Payer: 59

## 2019-10-18 ENCOUNTER — Ambulatory Visit: Admit: 2019-10-18 | Discharge: 2019-10-19 | Payer: 59

## 2019-10-18 ENCOUNTER — Encounter: Admit: 2019-10-18 | Discharge: 2019-10-18 | Payer: 59

## 2019-10-18 DIAGNOSIS — M545 Low back pain: Secondary | ICD-10-CM

## 2019-10-18 DIAGNOSIS — F419 Anxiety disorder, unspecified: Secondary | ICD-10-CM

## 2019-10-26 ENCOUNTER — Encounter: Admit: 2019-10-26 | Discharge: 2019-10-26 | Payer: 59

## 2019-10-31 ENCOUNTER — Encounter: Admit: 2019-10-31 | Discharge: 2019-10-31 | Payer: 59

## 2019-11-08 ENCOUNTER — Encounter: Admit: 2019-11-08 | Discharge: 2019-11-08 | Payer: 59

## 2019-11-10 MED ORDER — PREGABALIN 150 MG PO CAP
ORAL_CAPSULE | Freq: Three times a day (TID) | 5 refills | Status: AC
Start: 2019-11-10 — End: ?

## 2019-11-13 IMAGING — CT HEADWO
2 of 4 series · 13 of 47 positions shown, 16 images · non-contrast
Comparison: none

[Series 2: brain ax 5.00 hr40 s3 · axial · 0.36mm/px · z∈[-612,-484]mm · 10 of 32 slices shown, 13 images]
[im 3/32  brain]
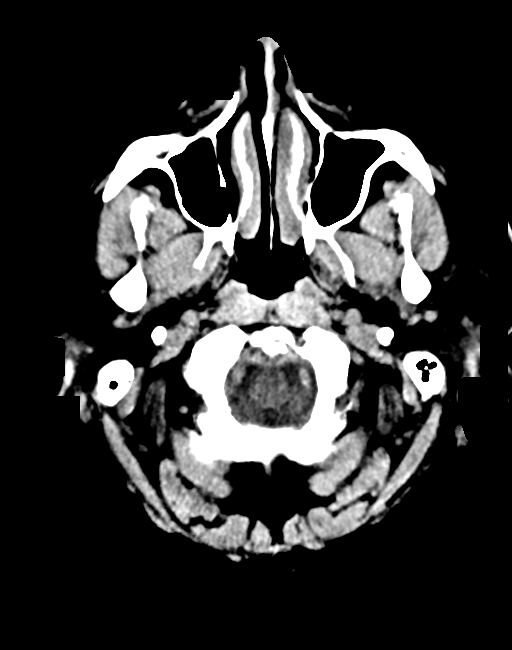
[im 3/32  bone]
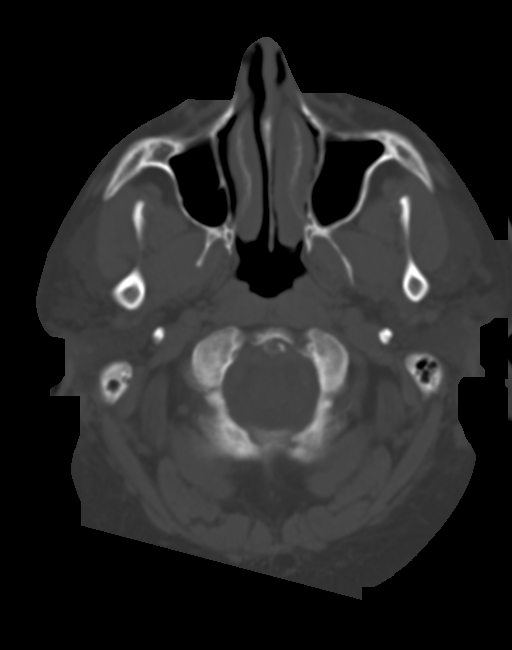
[im 5/32  brain]
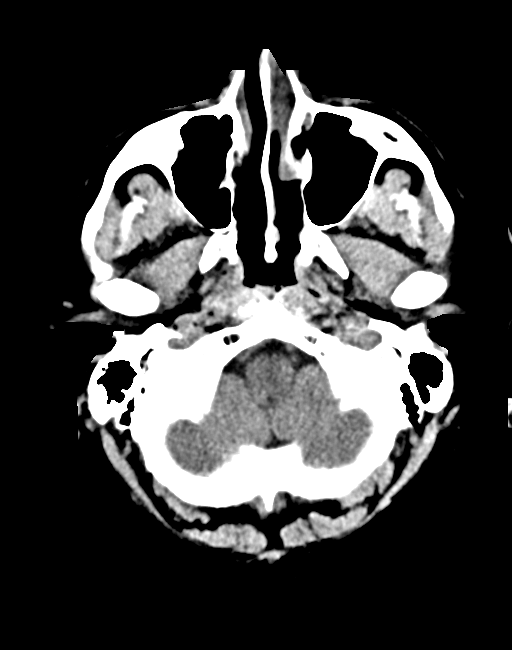
[im 9/32  brain]
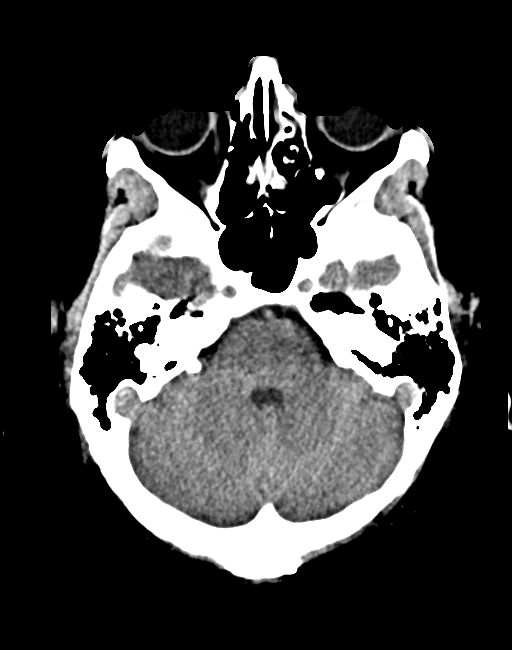
[im 12/32  brain]
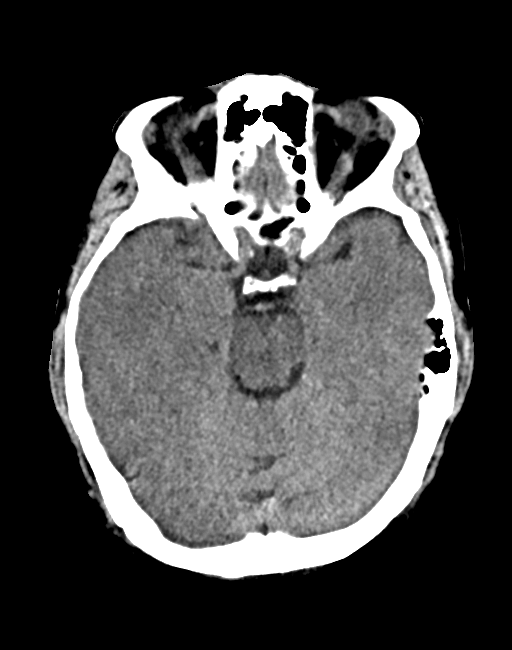
[im 14/32  brain]
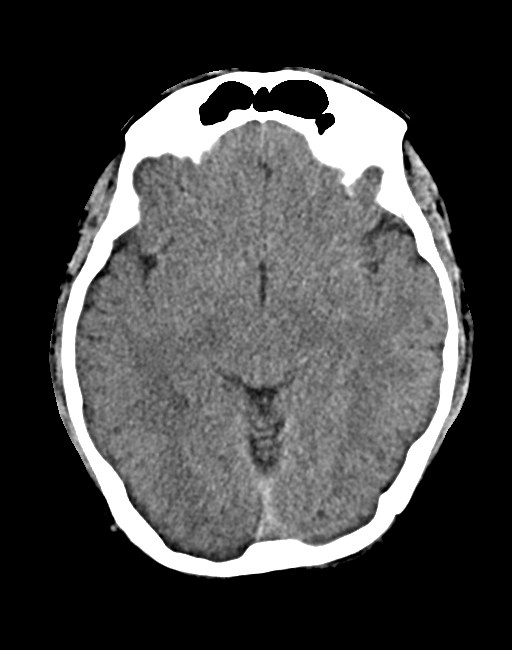
[im 14/32  bone]
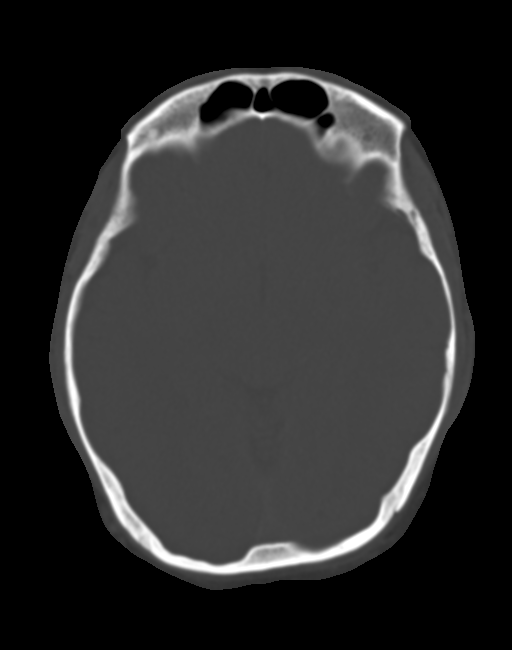
[im 18/32  brain]
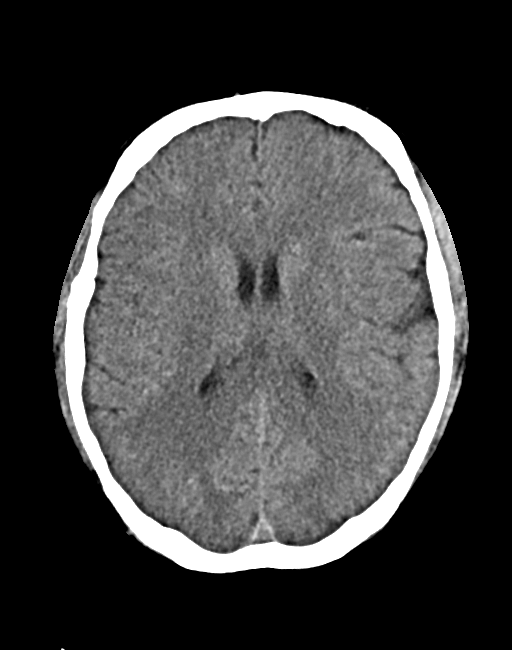
[im 20/32  brain]
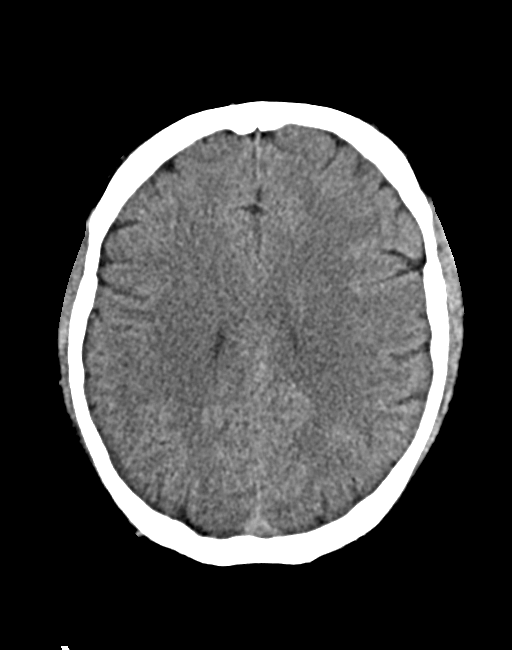
[im 23/32  brain]
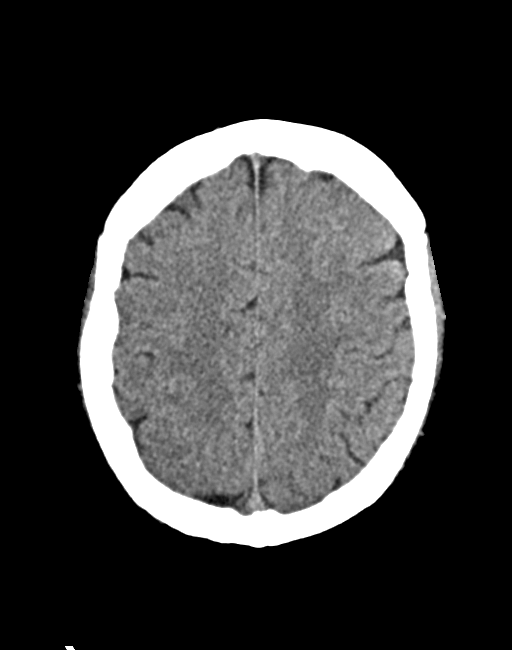
[im 27/32  brain]
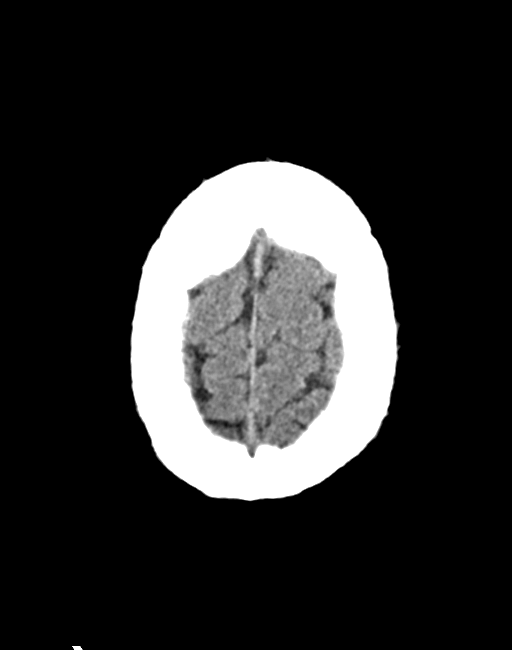
[im 27/32  bone]
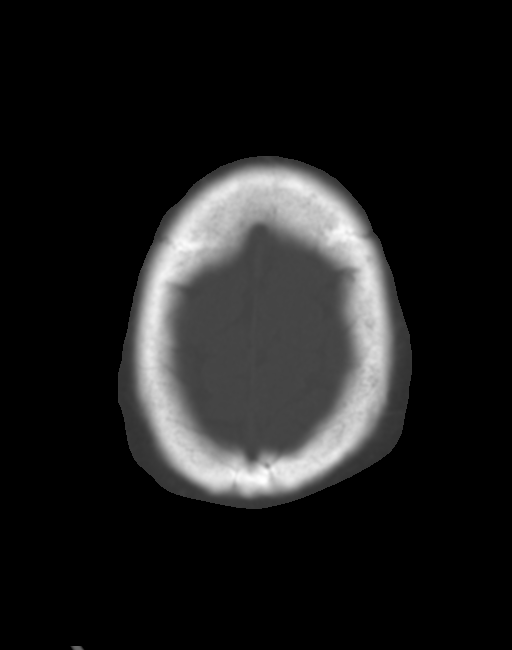
[im 29/32  brain]
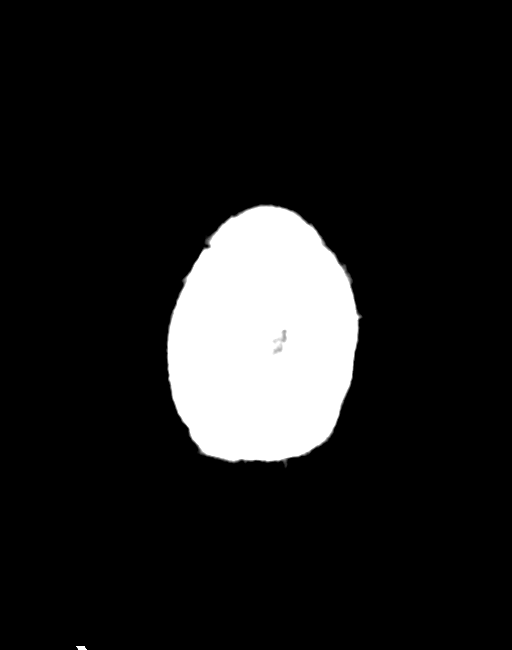

[Series 3: brain cor 5.00 hr40 s3 · coronal · 0.32mm/px · 3 of 46 slices shown]
[im 16/46  brain]
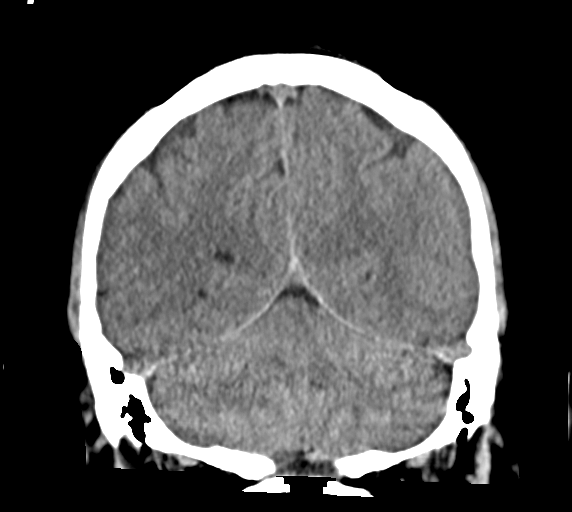
[im 21/46  brain]
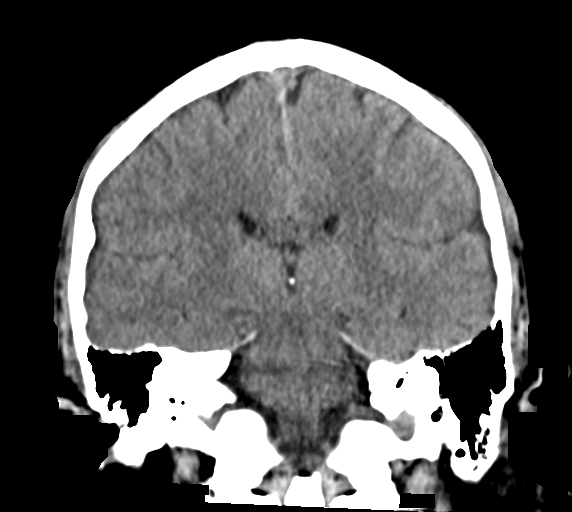
[im 26/46  brain]
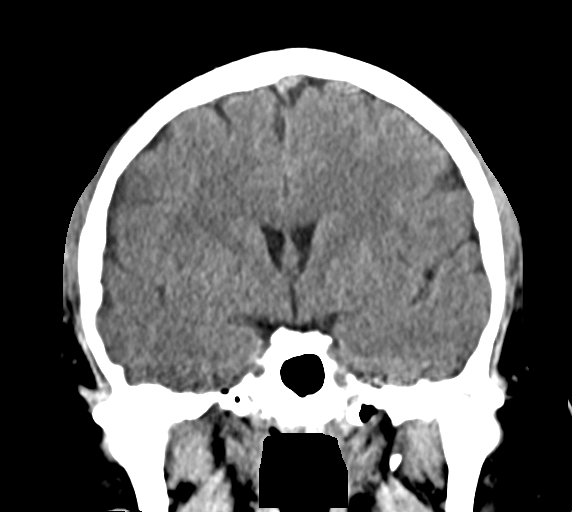

[13 of 47 positions shown; findings below may reference images not displayed]

DIAGNOSTIC STUDIES

EXAM

COMPUTED TOMOGRAPHY, HEAD OR BRAIN; WITHOUT CONTRAST MATERIAL, CPT 44064

INDICATION

left hand numb  left arm pain  HA
PT C/O NECK PAIN INTO LEFT SHOULDER. NKI. CT/NM 1/0. TJ/JC

TECHNIQUE

Multiple contiguous transaxial images were obtained through the brain utilizing a multidetector CT
scanner.

Number of previous computed tomography exams in the last 12 months is 1 .

Number of previous nuclear medicine myocardial perfusion studies in the last 12 months is 0 .

All CT scans at this facility use dose modulation, iterative reconstruction, and/or weight based
dosing when appropriate to reduce radiation dose to as low as reasonably achievable.

COMPARISONS

Previous examination dated 09/13/2018.

FINDINGS

The ventricles and sulci appear are normal in caliber. There is no mass effect or shift in the
midline structures. There are no intra or extra axial collections of blood or fluid present. The
skull base and overlying calvarium are within normal limits.

IMPRESSION

Normal CT brain.

Tech Notes:

PT C/O NECK PAIN INTO LEFT SHOULDER. NKI. CT/NM 1/0. TJ/JC

## 2020-08-20 ENCOUNTER — Encounter: Admit: 2020-08-20 | Discharge: 2020-08-20 | Payer: 59

## 2020-08-20 MED ORDER — PREGABALIN 150 MG PO CAP
ORAL_CAPSULE | Freq: Three times a day (TID) | 0 refills
Start: 2020-08-20 — End: ?

## 2020-10-12 ENCOUNTER — Encounter: Admit: 2020-10-12 | Discharge: 2020-10-12 | Payer: 59

## 2020-10-12 MED ORDER — PREGABALIN 150 MG PO CAP
ORAL_CAPSULE | Freq: Three times a day (TID) | 0 refills
Start: 2020-10-12 — End: ?

## 2020-10-27 ENCOUNTER — Encounter: Admit: 2020-10-27 | Discharge: 2020-10-27 | Payer: 59

## 2020-10-27 MED ORDER — PREGABALIN 150 MG PO CAP
ORAL_CAPSULE | Freq: Three times a day (TID) | 0 refills
Start: 2020-10-27 — End: ?

## 2021-06-17 ENCOUNTER — Encounter: Admit: 2021-06-17 | Discharge: 2021-06-17 | Payer: 59

## 2021-06-19 ENCOUNTER — Encounter: Admit: 2021-06-19 | Discharge: 2021-06-19 | Payer: 59

## 2021-06-19 NOTE — Telephone Encounter
06-19-21 Per Work [employer], no records ordered,see note below, clp    06/26/2021 Prior Cardiologist over 20 yrs ago doesn't remember where or name

## 2021-07-02 ENCOUNTER — Encounter: Admit: 2021-07-02 | Discharge: 2021-07-02 | Payer: 59

## 2021-11-06 ENCOUNTER — Encounter: Admit: 2021-11-06 | Discharge: 2021-11-06 | Payer: 59

## 2021-11-08 ENCOUNTER — Encounter: Admit: 2021-11-08 | Discharge: 2021-11-08 | Payer: 59

## 2021-11-08 DIAGNOSIS — I10 Essential (primary) hypertension: Secondary | ICD-10-CM

## 2021-11-08 DIAGNOSIS — F419 Anxiety disorder, unspecified: Secondary | ICD-10-CM

## 2021-11-08 DIAGNOSIS — M545 Low back pain: Secondary | ICD-10-CM

## 2021-11-14 ENCOUNTER — Encounter: Admit: 2021-11-14 | Discharge: 2021-11-14 | Payer: 59

## 2021-11-14 DIAGNOSIS — R519 Bad headache: Secondary | ICD-10-CM

## 2021-11-14 DIAGNOSIS — M545 Low back pain: Secondary | ICD-10-CM

## 2021-11-14 DIAGNOSIS — I10 Essential (primary) hypertension: Secondary | ICD-10-CM

## 2021-11-14 DIAGNOSIS — E785 Hyperlipidemia, unspecified: Secondary | ICD-10-CM

## 2021-11-14 DIAGNOSIS — E8881 Metabolic syndrome: Secondary | ICD-10-CM

## 2021-11-14 DIAGNOSIS — F419 Anxiety disorder, unspecified: Secondary | ICD-10-CM

## 2021-11-14 DIAGNOSIS — G4733 Obstructive sleep apnea (adult) (pediatric): Secondary | ICD-10-CM

## 2021-11-14 MED ORDER — ROSUVASTATIN 10 MG PO TAB
10 mg | ORAL_TABLET | Freq: Every day | ORAL | 4 refills | 90.00000 days | Status: AC
Start: 2021-11-14 — End: ?

## 2021-11-14 MED ORDER — OLMESARTAN-HYDROCHLOROTHIAZIDE 20-12.5 MG PO TAB
1 | ORAL_TABLET | Freq: Every day | ORAL | 3 refills | Status: AC
Start: 2021-11-14 — End: ?

## 2021-11-14 MED ORDER — HYDROCHLOROTHIAZIDE 12.5 MG PO CAP
12.5 mg | ORAL_CAPSULE | Freq: Every morning | ORAL | 0 refills | 30.00000 days | Status: AC
Start: 2021-11-14 — End: ?

## 2021-11-14 NOTE — Patient Instructions
Will arrange for echocardiogram  Refer to sleep clinic at [place]  Stop Fish oil, amitrityline, and buspar  Start Rosuvastatin 10 mg daily  Start Hydrochlorathiazide 12.5 mg daily.  When due for refill of hydrochrlorathiazide stop individual pills of olmesartan and hydrochrolorathiazide and start combination tablet  Return visit in 4-6 weeks

## 2021-11-15 ENCOUNTER — Encounter: Admit: 2021-11-15 | Discharge: 2021-11-15 | Payer: 59

## 2021-11-20 ENCOUNTER — Encounter: Admit: 2021-11-20 | Discharge: 2021-11-20 | Payer: 59

## 2021-11-20 DIAGNOSIS — R0602 Shortness of breath: Secondary | ICD-10-CM

## 2021-11-20 DIAGNOSIS — R42 Dizziness and giddiness: Secondary | ICD-10-CM

## 2021-11-20 DIAGNOSIS — I10 Essential (primary) hypertension: Secondary | ICD-10-CM

## 2021-11-26 ENCOUNTER — Encounter: Admit: 2021-11-26 | Discharge: 2021-11-26 | Payer: 59

## 2021-11-26 ENCOUNTER — Ambulatory Visit: Admit: 2021-11-26 | Discharge: 2021-11-26 | Payer: 59

## 2021-11-26 DIAGNOSIS — I10 Essential (primary) hypertension: Secondary | ICD-10-CM

## 2021-11-26 DIAGNOSIS — G4733 Obstructive sleep apnea (adult) (pediatric): Secondary | ICD-10-CM

## 2021-11-28 ENCOUNTER — Encounter: Admit: 2021-11-28 | Discharge: 2021-11-28 | Payer: 59

## 2021-11-28 NOTE — Telephone Encounter
Called and discussed results with patient.  No questions at this time.  Pt will callback with any questions, concerns or problems.

## 2021-11-28 NOTE — Telephone Encounter
-----   Message from Valora Piccolo, RN sent at 11/28/2021  8:23 AM CDT -----    ----- Message -----  From: Levora Angel, MD  Sent: 11/28/2021   8:22 AM CDT  To: Cvm Nurse Gen Card Team [place]    Let him know echo demonstrated no concerning findings with normal heart function. thanks  ----- Message -----  From: Lucianne Muss, MD  Sent: 11/26/2021   4:24 PM CDT  To: Levora Angel, MD

## 2021-12-10 ENCOUNTER — Encounter: Admit: 2021-12-10 | Discharge: 2021-12-10 | Payer: 59

## 2021-12-10 MED ORDER — HYDROCHLOROTHIAZIDE 12.5 MG PO CAP
ORAL_CAPSULE | ORAL | 3 refills | 30.00000 days | Status: AC
Start: 2021-12-10 — End: ?

## 2022-01-28 ENCOUNTER — Ambulatory Visit: Admit: 2022-01-28 | Discharge: 2022-01-29 | Payer: 59

## 2022-01-28 ENCOUNTER — Encounter: Admit: 2022-01-28 | Discharge: 2022-01-28 | Payer: 59

## 2022-01-28 DIAGNOSIS — I10 Essential (primary) hypertension: Secondary | ICD-10-CM

## 2022-01-28 DIAGNOSIS — R519 Bad headache: Secondary | ICD-10-CM

## 2022-01-28 DIAGNOSIS — F419 Anxiety disorder, unspecified: Secondary | ICD-10-CM

## 2022-01-28 DIAGNOSIS — M545 Low back pain: Secondary | ICD-10-CM

## 2022-01-28 DIAGNOSIS — E8881 Metabolic syndrome: Secondary | ICD-10-CM

## 2022-01-28 DIAGNOSIS — E785 Hyperlipidemia, unspecified: Secondary | ICD-10-CM

## 2022-01-28 NOTE — Progress Notes
Date of Service: 01/28/2022    George Rodgers is a 46 y.o. male.       HPI     Patient is a 46 year old gentleman past medical history of metabolic syndrome manifest by morbid obesity, severe obstructive sleep apnea now using CPAP fairly regularly, hypertension, hyperlipidemia, chronic pain disorder, recurrent severe headaches on multiple neuropsychological and pain medications.  Previous studies including echocardiogram did not reveal significant structural functional abnormality.  We have been able to make changes to his blood pressure medications and now his blood pressure has been at goal.  Reports really has not struggled very much with headaches for approximately 3 months, did have to go to ER for severe headache about a week ago but that now is resolved and has not reoccurred.  Has lost about 10 pounds noting that still fluctuate a fair amount but he is working on better diet control.  Reports he does think he feels better using his CPAP and has been working hard to try to be consistent with using it.  Does have appointment with the sleep medicine clinic in the next 1 to 2 months.  No other chest pains or palpitations.  Is more concerned with symptoms in his foot where he thinks he has Planter fasciitis and is going to see it podiatrist.         Vitals:    01/28/22 1518   BP: 128/86   BP Source: Arm, Left Upper   Pulse: 88   SpO2: 96%   O2 Device: None (Room air)   PainSc: Zero   Weight: (!) 193.1 kg (425 lb 12.8 oz)   Height: 188 cm (6' 2)     Body mass index is 54.67 kg/m?Marland Kitchen     Past Medical History  Patient Active Problem List    Diagnosis Date Noted   ? Anxiety and depression 11/08/2021   ? Dizziness 11/08/2021   ? Dyslipidemia 11/08/2021   ? Chronic back pain 11/08/2021   ? Multiple risk factors for coronary artery disease 11/08/2021   ? Tachycardia 11/08/2021   ? OSA (obstructive sleep apnea) 11/08/2021   ? Primary hypertension 11/08/2021   ? Morbid obesity (HCC) 11/08/2021   ? Degenerative disc disease, lumbar 09/14/2019   ? Compression fracture of body of thoracic vertebra (HCC) 01/29/2018         Review of Systems   Constitutional: Negative.   HENT: Negative.    Eyes: Negative.    Cardiovascular: Negative.    Respiratory: Negative.    Endocrine: Negative.    Hematologic/Lymphatic: Negative.    Skin: Negative.    Musculoskeletal: Negative.    Gastrointestinal: Negative.    Genitourinary: Negative.    Neurological: Negative.    Psychiatric/Behavioral: Negative.    Allergic/Immunologic: Negative.        Physical Exam  Awake and alert, no distress, more interactive than usual.  His wife is pleasant  Obese gentleman  Pupils are equal react without scleral injection, wearing his glasses  Neck is enlarged circumference, no masses or carotid bruits.  No obvious jugular venous abnormalities  Lungs are clear to auscultation  Heart S1, S2 normal.  Abdomen is obese  Pulse are 2+, regular, and symmetric bilaterally radial locations, difficult to appreciate pedal pulses because of his shoes  No peripheral edema normal hair growth with symmetric muscle tone and skin turgor    Cardiovascular Studies      Cardiovascular Health Factors  Vitals BP Readings from Last 3 Encounters:  01/28/22 128/86   11/26/21 134/88   11/14/21 (!) 138/92     Wt Readings from Last 3 Encounters:   01/28/22 (!) 193.1 kg (425 lb 12.8 oz)   11/26/21 (!) 196 kg (432 lb 1.6 oz)   11/14/21 (!) 197.6 kg (435 lb 9.6 oz)     BMI Readings from Last 3 Encounters:   01/28/22 54.67 kg/m?   11/26/21 55.48 kg/m?   11/14/21 55.93 kg/m?      Smoking Social History     Tobacco Use   Smoking Status Never   Smokeless Tobacco Never   Vaping Use   ? Vaping Use: Never used      Lipid Profile Cholesterol   Date Value Ref Range Status   03/27/2021 239 (H) <200 Final     HDL   Date Value Ref Range Status   03/27/2021 51  Final     LDL   Date Value Ref Range Status   03/27/2021 163 (H) <100 Final     Triglycerides   Date Value Ref Range Status 03/27/2021 126  Final      Blood Sugar No results found for: HGBA1C  Glucose   Date Value Ref Range Status   03/27/2021 99  Final   08/30/2019 99 70 - 100 MG/DL Final          Problems Addressed Today  Encounter Diagnoses   Name Primary?   ? Bad headache Yes   ? Metabolic syndrome    ? Dyslipidemia    ? Primary hypertension        Assessment and Plan     Blood pressure is currently at goal with the combination olmesartan/hydrochlorothiazide.  He tolerates that medicine and made no changes.  Also is doing well the rosuvastatin is stopped taking the fish oil.  No change in that therapy and we will plan to reassess his lipid profile at his next visit.  Is doing a better job of using his CPAP and can tell a difference as far as symptoms with both his headaches and overall energy.  We will continue current therapy and does have the plan to follow-up with the sleep clinic.  Did discuss potentially working at changing her medicines.  Did have to go back on the BuSpar because of mood outbreaks and a lot of edginess.  Reports that is much better while he is taking the medication.  Has been able to stay away from the amitriptyline without difficulty.  At this time I did not make any changes to his therapy.  Reinforced importance of lifestyle changes and continue to work for weight loss.  Would put his goal of weight less than 4 pounds at his next visit.  Routine follow-up can be in approximately 6 months.  And we will check his lipid profile and BMP at that time.         Current Medications (including today's revisions)  ? aspirin/acetaminophen/caffeine (EXCEDRIN MIGRAINE) 250/250/65 mg tablet Take one tablet by mouth every 4-6 hours as needed.   ? baclofen (LIORESAL) 10 mg tablet Take one tablet by mouth three times daily.   ? busPIRone (BUSPAR) 10 mg tablet Take one tablet by mouth twice daily.   ? butalbital-acetaminophen-caffeine (FIORICET) 50-300-40 mg capsule Take one capsule by mouth three times daily as needed.   ? duloxetine DR (CYMBALTA) 60 mg capsule Take two capsules by mouth at bedtime daily.   ? fremanezumab-vfrm (AJOVY SYRINGE) 225 mg/1.5 mL syringe Inject two hundred twenty five mg under the  skin every 30 days.   ? hydroCHLOROthiazide 12.5 mg capsule TAKE 1 CAPSULE BY MOUTH EVERY DAY IN THE MORNING   ? HYDROcodone/acetaminophen (NORCO) 5/325 mg tablet Take one tablet by mouth every 4 hours as needed for Pain   ? HYDROcodone/acetaminophen (NORCO) 5/325 mg tablet Take one tablet to two tablets by mouth every 4 hours as needed for Pain   ? meclizine (ANTIVERT) 25 mg tablet Take one tablet by mouth as Needed.   ? meloxicam (MOBIC) 7.5 mg tablet Take one tablet by mouth twice daily.   ? olmesartan-hydroCHLOROthiazide (BENICAR HCT) 20-12.5 mg tablet Take one tablet by mouth daily.   ? oxyCODONE-acetaminophen (PERCOCET) 10/325 mg tablet Take one tablet by mouth as Needed.   ? pregabalin (LYRICA) 150 mg capsule Take one capsule by mouth three times daily.   ? rizatriptan (MAXALT-MLT) 10 mg rapid dissolve tablet Dissolve one tablet by mouth as Needed.   ? rosuvastatin (CRESTOR) 10 mg tablet Take one tablet by mouth daily.   ? topiramate (TOPAMAX) 100 mg tablet Take one tablet by mouth twice daily.

## 2022-01-31 ENCOUNTER — Encounter: Admit: 2022-01-31 | Discharge: 2022-01-31 | Payer: 59

## 2022-02-18 ENCOUNTER — Encounter: Admit: 2022-02-18 | Discharge: 2022-02-18 | Payer: 59

## 2022-02-18 ENCOUNTER — Ambulatory Visit: Admit: 2022-02-18 | Discharge: 2022-02-19 | Payer: 59

## 2022-02-18 DIAGNOSIS — G4719 Other hypersomnia: Secondary | ICD-10-CM

## 2022-02-18 DIAGNOSIS — Z9989 Dependence on other enabling machines and devices: Secondary | ICD-10-CM

## 2022-02-18 NOTE — Telephone Encounter
An encounter has been created for documentation only (often for preparation of an upcoming appointment or for follow up on orders/imaging or records received) and patient does not need contact RN and did not miss a phone call or appointment.     Pre-visit planning for upcoming appointment with Dr.Luthra      -Referred IY:MEBR MD Eplee (Referring)  NPI: [id number]  [phone] (Work)  [address]  [place], [place] [address]    -Referral DX:[facility]  -Last SS: pt reports 2019    -Was diagnosed with obstructive sleep apnea approximately 3 years ago.  Reports he tries uses CPAP but struggles with having on with his glasses while watching TV and falls asleep and the night and the mask digs into his face over the course of the night.     -Meds: gabapentin,oxycodone,meloxicam

## 2022-02-19 DIAGNOSIS — E8881 Metabolic syndrome: Secondary | ICD-10-CM

## 2022-02-19 DIAGNOSIS — E785 Hyperlipidemia, unspecified: Secondary | ICD-10-CM

## 2022-02-19 DIAGNOSIS — R519 Headache, unspecified: Secondary | ICD-10-CM

## 2022-02-19 DIAGNOSIS — I10 Essential (primary) hypertension: Secondary | ICD-10-CM

## 2022-02-19 DIAGNOSIS — G4733 Obstructive sleep apnea (adult) (pediatric): Secondary | ICD-10-CM

## 2022-03-03 ENCOUNTER — Encounter: Admit: 2022-03-03 | Discharge: 2022-03-03 | Payer: 59

## 2022-03-03 DIAGNOSIS — M545 Low back pain: Secondary | ICD-10-CM

## 2022-03-03 DIAGNOSIS — F419 Anxiety disorder, unspecified: Secondary | ICD-10-CM

## 2022-03-03 DIAGNOSIS — I10 Essential (primary) hypertension: Secondary | ICD-10-CM

## 2022-06-06 IMAGING — US ECHOCOMPL
1 series · 12 of 24 positions shown · non-contrast
Comparison: none

[Series 1: us echo 2d, complete · 89 acquisitions, 12 frames shown]
[im 4/89]
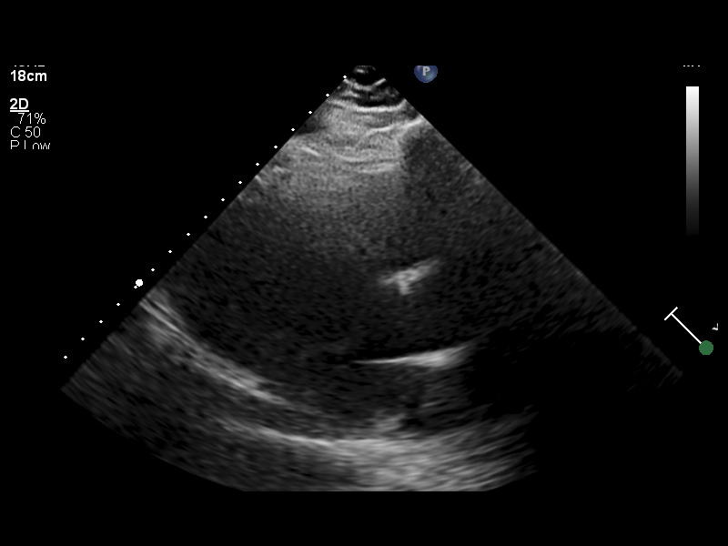
[im 8/89]
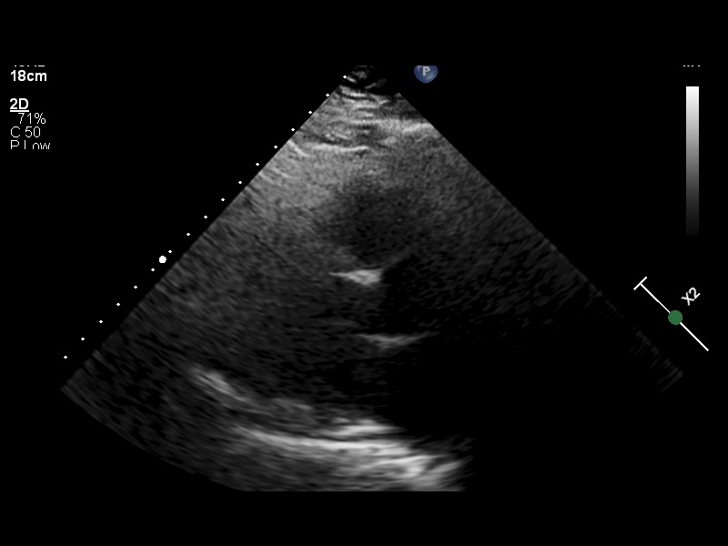
[im 20/89]
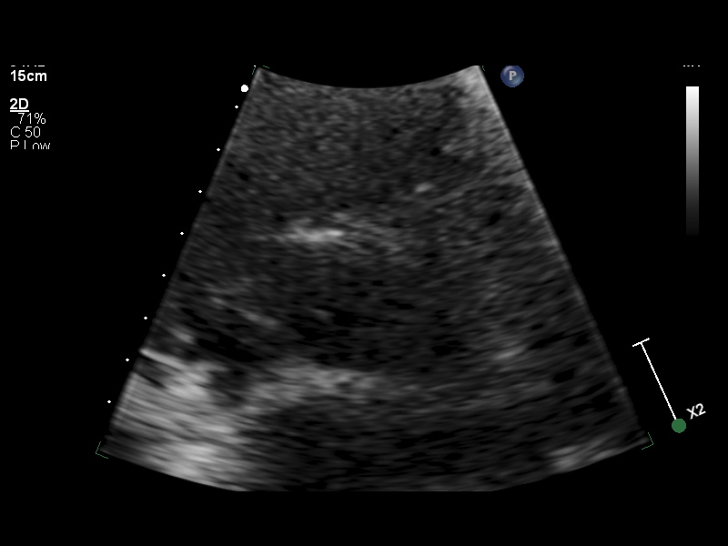
[im 23/89]
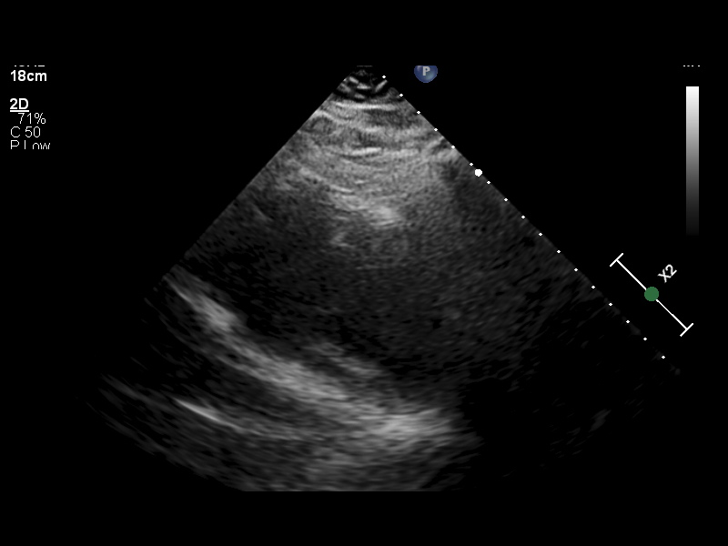
[im 35/89]
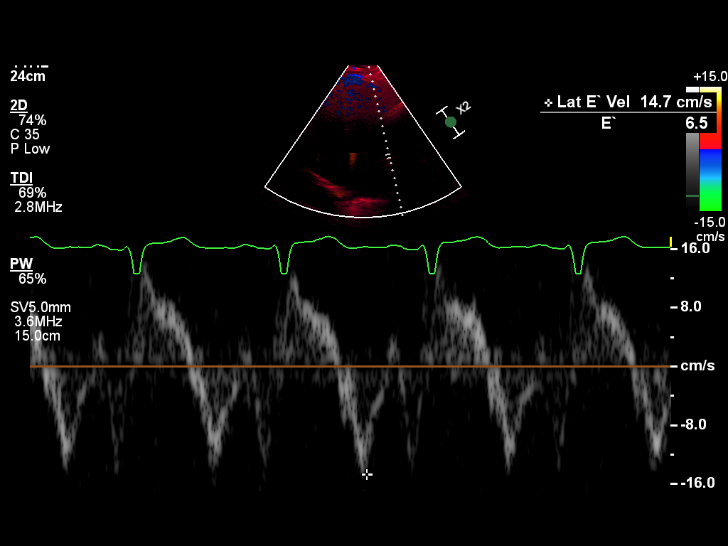
[im 46/89]
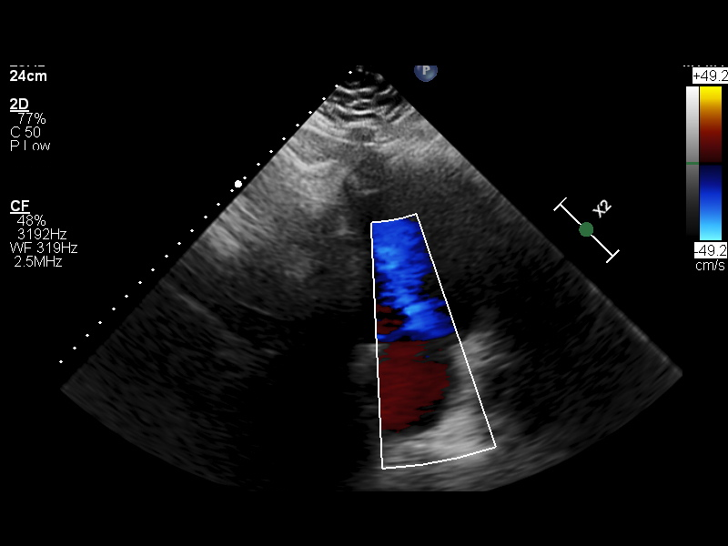
[im 54/89]
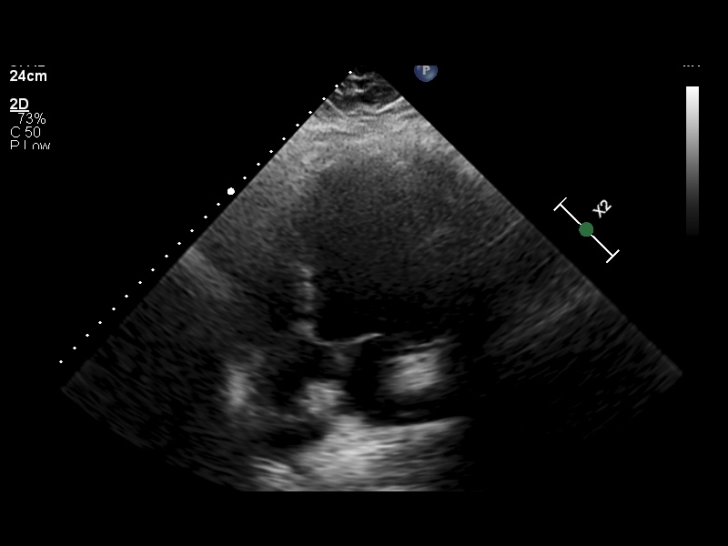
[im 58/89]
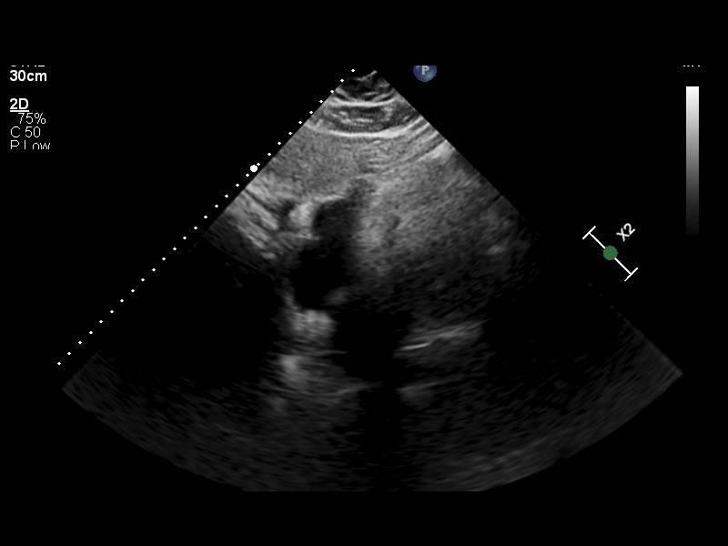
[im 66/89]
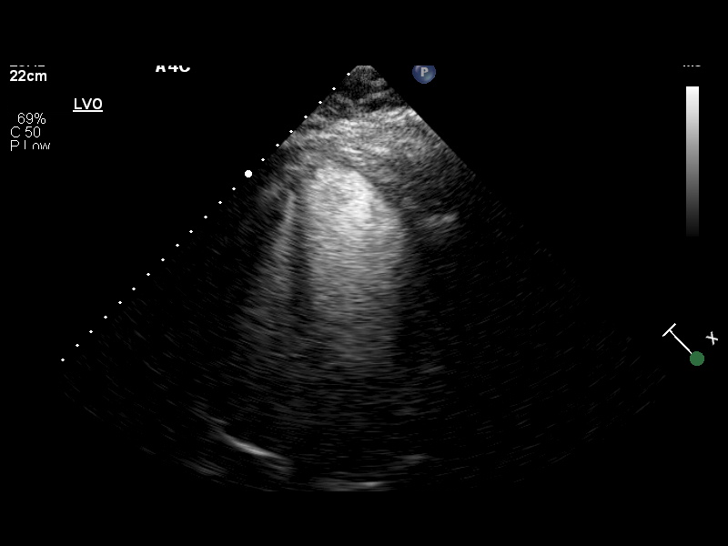
[im 73/89]
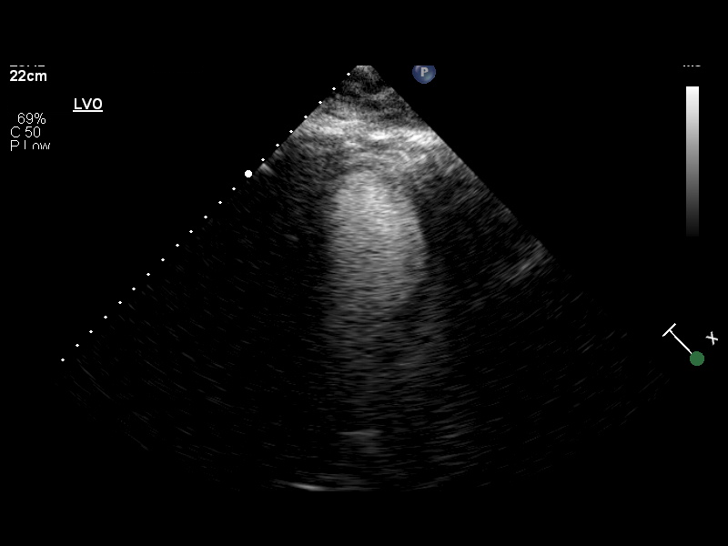
[im 81/89]
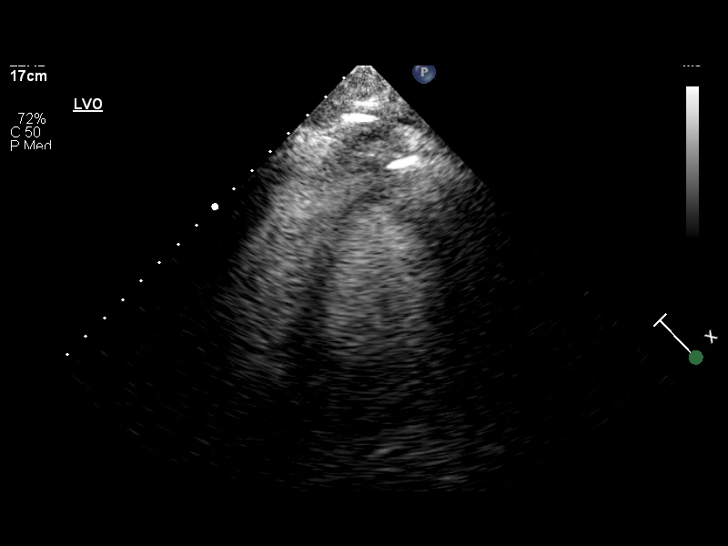
[im 89/89]
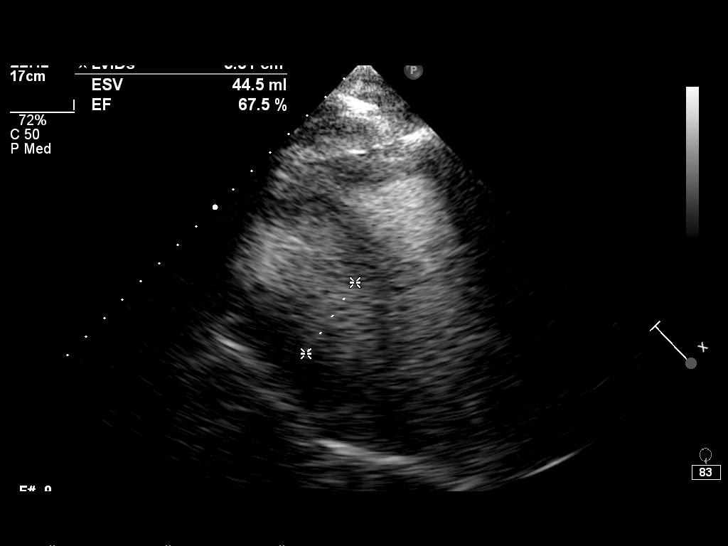

[12 of 24 positions shown; findings below may reference images not displayed]

11/26/21 -  2D + DOPPLER ECHO
Location Performed: [HOSPITAL]

Referring Provider:
Interpreting Physician: Prom, Bones
Hosea:
Location of Interp:
Sonographer: External Staff
Machine:  [facility]

Indications: Hypertension          Metabolic syndrome, HA, OSA

Definity contrast was given to enhance imaging. Technically difficult study.

Vitals
Height   Weight   BSA (Calculated)   BP   Comments
188 cm (6' 2")   196 kg (432 lb 1.6 oz)   3.2   134/88

Interpretation Summary
Left Ventricle: The left ventricular size is normal. Concentric remodeling. The left ventricular
systolic function is normal. The visually estimated ejection fraction is 55%. There are no segmental
wall motion abnormalities. Normal left ventricular diastolic function. Normal left atrial pressure.
Right Ventricle: Ventricle not well seen. The right ventricular size is normal.
The aortic valve is not well visualized, no hemodynamically significant valvular abnormalities.
Normal central venous pressure

Echocardiographic Findings
Left Ventricle   The left ventricular size is normal. Concentric remodeling. The left ventricular
systolic function is normal. The visually estimated ejection fraction is 55%. There are no segmental
wall motion abnormalities. Normal left ventricular diastolic function. Normal left atrial pressure.
Right Ventricle   Ventricle not well seen. The right ventricular size is normal. The pulmonary
artery pressure could not be estimated due to inadequate tricuspid regurgitation signal.
Left Atrium   Normal size.
Right Atrium   Mildly dilated.
IVC/SVC   Normal central venous pressure (0-5 mm Hg).
Mitral Valve   Normal valve structure. No stenosis. No regurgitation.
Tricuspid Valve   Normal valve structure. No stenosis. No regurgitation.
Aortic Valve   The aortic valve was not well seen. No stenosis. No regurgitation.
Pulmonary   The pulmonic valve was not seen well but no Doppler evidence of stenosis. No
regurgitation.
Aorta   The aortic root is normal in size.
Pericardium   No pericardial effusion.

Left Ventricular Wall Scoring
Resting   Score Index: 1.000   Percent Normal: 100.0%

The left ventricular wall motion is normal.

Left Heart 2D Measurements (Normal Ranges)
EF (Visual)
55 %
EF (Simpson's)
58 %
LVIDD
5.3 cm  (Range: 4.2 - 5.8)
LVIDS
3.3 cm  (Range: 2.5 - 4.0)
IVS
1.3 cm  (Range: 0.6 - 1.0)
LV PW
1.2 cm  (Range: 0.6 - 1.0)
LA Size
4.4 cm  (Range: 3.0 - 4.0)

Right Heart 2D   M-Mode Measurements (Normal Ranges) (Range)
RV Basal Dia
3.9 cm  (2.5 - 4.1)
RV Mid Dia
3.5 cm  (1.9 - 3.5)
WITT
19.0 cm2  (<18)
M-Mode TAPSE
2.3 cm  (>1.7)

Left Heart 2D Addnl Measurements (Normal Ranges)
LV Systolic Vol
78 mL  (Range: 21 - 61)
LV Systolic Vol Index
24 mL/m2  (Range: 11 - 31)
LV Diastolic Vol
183 mL  (Range: 62 - 150)
LV Diastolic Vol Index
57 mL/m2  (Range: 34 - 74)
LA Vol
88 mL  (Range: 18 - 58)
LA Vol Index
27.50 mL/m2  (Range: 16 - 34)
LV Mass
272 g  (Range: 88 - 224)
LV Mass Index
85 g/m2  (Range: 49 - 115)
RWT
0.45  (Range: <=0.42)

Aortic Root Measurements (Normal Ranges)
Sinus
3.7 cm  (Range: 2.8 - 4.0)

Doppler (Spectral and Color Flow)
Aortic valve area
3.90 cm2
Aortic valve mean gradient
Aortic valve peak gradient
Aortic valve peak velocity
1.7 m/s
Aortic valve velocity ratio

Tech Notes:

Definity RT 4694. NKDA. JL

## 2022-08-17 ENCOUNTER — Encounter: Admit: 2022-08-17 | Discharge: 2022-08-17 | Payer: 59

## 2022-09-10 ENCOUNTER — Encounter: Admit: 2022-09-10 | Discharge: 2022-09-10 | Payer: 59

## 2022-09-27 ENCOUNTER — Encounter: Admit: 2022-09-27 | Discharge: 2022-09-27 | Payer: 59

## 2022-10-03 ENCOUNTER — Encounter: Admit: 2022-10-03 | Discharge: 2022-10-03 | Payer: 59

## 2022-10-03 DIAGNOSIS — Z9989 Dependence on other enabling machines and devices: Secondary | ICD-10-CM

## 2022-10-03 DIAGNOSIS — G4719 Other hypersomnia: Secondary | ICD-10-CM

## 2022-10-04 ENCOUNTER — Ambulatory Visit: Admit: 2022-10-04 | Discharge: 2022-10-03 | Payer: 59

## 2022-10-06 ENCOUNTER — Encounter: Admit: 2022-10-06 | Discharge: 2022-10-06 | Payer: 59

## 2022-10-06 DIAGNOSIS — R519 Bad headache: Secondary | ICD-10-CM

## 2022-10-06 DIAGNOSIS — I10 Essential (primary) hypertension: Secondary | ICD-10-CM

## 2022-10-06 DIAGNOSIS — E785 Hyperlipidemia, unspecified: Secondary | ICD-10-CM

## 2022-10-06 DIAGNOSIS — E8881 Metabolic syndrome: Secondary | ICD-10-CM

## 2022-10-07 ENCOUNTER — Encounter: Admit: 2022-10-07 | Discharge: 2022-10-07 | Payer: 59

## 2022-10-07 DIAGNOSIS — R42 Dizziness and giddiness: Secondary | ICD-10-CM

## 2022-10-07 DIAGNOSIS — I10 Essential (primary) hypertension: Secondary | ICD-10-CM

## 2022-10-07 DIAGNOSIS — E8881 Metabolic syndrome: Secondary | ICD-10-CM

## 2022-10-07 DIAGNOSIS — E7800 Pure hypercholesterolemia, unspecified: Secondary | ICD-10-CM

## 2022-10-07 DIAGNOSIS — G4733 Obstructive sleep apnea (adult) (pediatric): Secondary | ICD-10-CM

## 2022-10-07 DIAGNOSIS — G4730 Sleep apnea, unspecified: Secondary | ICD-10-CM

## 2022-10-07 DIAGNOSIS — F32A Depression: Secondary | ICD-10-CM

## 2022-10-07 DIAGNOSIS — F419 Anxiety disorder, unspecified: Secondary | ICD-10-CM

## 2022-10-07 DIAGNOSIS — R519 Bad headache: Secondary | ICD-10-CM

## 2022-10-07 DIAGNOSIS — M545 Low back pain: Secondary | ICD-10-CM

## 2022-10-07 NOTE — Patient Instructions
Your overall heart and vascular findings are markedly improved.  Continue current therapies.  Follow-up can be with your general physician.  Follow-up with me if needed

## 2022-10-20 ENCOUNTER — Encounter: Admit: 2022-10-20 | Discharge: 2022-10-20 | Payer: 59

## 2022-10-20 MED ORDER — ROSUVASTATIN 10 MG PO TAB
10 mg | ORAL_TABLET | Freq: Every day | ORAL | 3 refills | 90.00000 days | Status: AC
Start: 2022-10-20 — End: ?

## 2022-11-06 ENCOUNTER — Encounter: Admit: 2022-11-06 | Discharge: 2022-11-06 | Payer: 59

## 2022-11-17 ENCOUNTER — Encounter: Admit: 2022-11-17 | Discharge: 2022-11-17 | Payer: 59

## 2022-11-17 MED ORDER — OLMESARTAN-HYDROCHLOROTHIAZIDE 20-12.5 MG PO TAB
1 | ORAL_TABLET | Freq: Every day | ORAL | 3 refills
Start: 2022-11-17 — End: ?

## 2023-07-06 ENCOUNTER — Encounter: Admit: 2023-07-06 | Discharge: 2023-07-06 | Payer: PRIVATE HEALTH INSURANCE

## 2023-09-03 ENCOUNTER — Encounter: Admit: 2023-09-03 | Discharge: 2023-09-03 | Payer: 59

## 2023-09-03 ENCOUNTER — Encounter: Admit: 2023-09-03 | Discharge: 2023-09-03 | Payer: PRIVATE HEALTH INSURANCE

## 2023-09-03 NOTE — Telephone Encounter
An encounter has been created for documentation only (often for preparation of an upcoming appointment or for follow up on orders/imaging or records received) and patient does not need contact RN and did not miss a phone call or appointment.     Pre-visit planning for upcoming appointment with Dr.Luthra    Here for sleep study follow up    Dx: OSA, Excessive Daytime Sleepiness    Last office visit with Dr.Luthra: 7/18/123  plan to proceed with Split night sleep study    Pertinent testing since last office visit:   SS: 10/03/22 with AHI 6.8

## 2023-09-09 ENCOUNTER — Encounter: Admit: 2023-09-09 | Discharge: 2023-09-09 | Payer: PRIVATE HEALTH INSURANCE

## 2023-09-09 ENCOUNTER — Encounter: Admit: 2023-09-09 | Discharge: 2023-09-09 | Payer: 59

## 2023-09-09 ENCOUNTER — Ambulatory Visit: Admit: 2023-09-09 | Discharge: 2023-09-10 | Payer: 59

## 2023-09-09 DIAGNOSIS — G4719 Other hypersomnia: Secondary | ICD-10-CM

## 2023-09-09 DIAGNOSIS — G4733 Obstructive sleep apnea (adult) (pediatric): Secondary | ICD-10-CM

## 2023-09-09 NOTE — Telephone Encounter
Order for AutoPAP 8-20cm H2O placed per recommendations.   DME: [facility]  Will continue to follow up.

## 2023-09-09 NOTE — Progress Notes
Date of Service: 09/09/2023  Telemedicine encounter was conducted using epic integrated telehealth interface, patient's consent for telemedicine appointment was obtained, patient was seen and evaluated on the above mentioned audio-video platform.        Margaretha Seeds Emmaus Brandi is a very pleasant 48 y.o. male presenting to [facility] for ongoing evaluation of Sleep Problem.      Subjective:     The patient presents for a follow-up visit regarding sleep apnea management.    He last visited in 2023 and was using a CPAP machine at that time. A sleep study conducted in the lab showed a mild degree of sleep apnea with an AHI of 6.8 per hour and no significant nocturnal hypoxemia. No CPAP titration was done due to the mild nature of the apnea.    He experiences ongoing daytime fatigue and chronic headaches, which are being evaluated by other specialists. Using the CPAP machine does not significantly alleviate his headaches or other symptoms. His current CPAP machine is over 31 years old, and it shows about six residual episodes per hour. He is unaware of his current CPAP settings and wants to obtain a new machine and undergo a mask fitting.    During the telehealth appointment, he was initially lying in bed. When not working, he often remains in bed. He works as a Museum/gallery curator.       Bedtime is 9 pm and waking up by 5 am.     Sleep ROS: no snoring, no choking/gasping spells, no dry mouth, no bloating, no congestion, other sleep ROS as above.     ESS 09/09/2023:       09/09/2023     5:28 AM 09/27/2022     8:49 AM 08/10/2022     9:55 AM 02/17/2022     8:35 AM   Epworth Sleepiness Scale   Sitting and reading 2 2 2 2    Watching TV 2 2 2 2    Sitting inactive in a public place (e.g. a theater or a meeting) 1 1 2 1    As a passenger in a car for an hour without a break 1 2 2 2    Lying down to rest in the afternoon when circumstances permit 3 2 2 1    Sitting and talking to someone 0 0 1 1   Sitting quietly after a lunch without alcohol 1 1 2 2    In a car, while stopped for a few minutes in traffic 1 1 1 1    Epworth Sleepiness Scale Score 11 11 14 12            Pre-Visit planning:   Here for sleep study follow up     Dx: OSA, Excessive Daytime Sleepiness     Last office visit with Dr.Mert Dietrick: 7/18/123  plan to proceed with Split night sleep study     Pertinent testing since last office visit:   SS: 10/03/22 with AHI 6.8     Past Medical History:    Anxiety disorder    Depression    Dizziness    High cholesterol    Low back pain    Morbid obesity (HCC)    Primary hypertension    Sleep apnea     Problem List:  2023-07: Excessive daytime sleepiness  2023-07: History of continuous positive airway pressure (CPAP) therapy  2023-04: Anxiety and depression  2023-04: Dizziness  2023-04: Dyslipidemia  2023-04: Chronic back pain  2023-04: Multiple risk factors for coronary artery disease  2023-04: Tachycardia  2023-04: OSA (obstructive sleep apnea)  2023-04: Primary hypertension  2023-04: Morbid obesity (HCC)  2021-02: Degenerative disc disease, lumbar  2019-06: Compression fracture of body of thoracic vertebra [facility])    Surgical History:   Procedure Laterality Date    HX TONSILLECTOMY  1983     Family History   Problem Relation Name Age of Onset    Arthritis Mother Selena Batten     Back pain Mother Selena Batten     Hypertension Mother Selena Batten     Joint Pain Mother Kim     Coronary Artery Disease Father Dene     Diabetes Father Ilija     Heart problem Father Jguadalupe     Hypertension Father Merchant navy officer     Heart Surgery Father Kainalu     Heart Attack Father Malic     Arthritis Mother Kim     Back pain Mother Selena Batten     Hypertension Mother Kim     Joint Pain Mother Kim     Diabetes Father Nicko     Heart problem Father Bluford     Hypertension Father Merchant navy officer      Social History     Socioeconomic History    Marital status: Married   Tobacco Use    Smoking status: Never    Smokeless tobacco: Never   Vaping Use    Vaping status: Never Used   Substance and Sexual Activity    Alcohol use: Yes Alcohol/week: 2.0 standard drinks of alcohol     Types: 2 Cans of beer per week     Comment: Socially    Drug use: Never    Sexual activity: Yes     Partners: Female     Birth control/protection: I.U.D., None       Objective:          baclofen (LIORESAL) 10 mg tablet Take one tablet by mouth three times daily.    busPIRone (BUSPAR) 10 mg tablet Take one tablet by mouth twice daily.    duloxetine DR (CYMBALTA) 60 mg capsule Take two capsules by mouth at bedtime daily.    EMGALITY PEN 120 mg/mL subcutaneous PEN     meclizine (ANTIVERT) 25 mg tablet Take one tablet by mouth as Needed.    meloxicam (MOBIC) 7.5 mg tablet Take one tablet by mouth twice daily.    olmesartan-hydroCHLOROthiazide (BENICAR HCT) 20-12.5 mg tablet TAKE 1 TABLET BY MOUTH DAILY    pregabalin (LYRICA) 150 mg capsule Take one capsule by mouth three times daily.    rizatriptan (MAXALT-MLT) 10 mg rapid dissolve tablet Dissolve one tablet by mouth as Needed.    rosuvastatin (CRESTOR) 10 mg tablet TAKE 1 TABLET BY MOUTH DAILY    topiramate (TOPAMAX) 100 mg tablet Take one tablet by mouth twice daily.      Telehealth Patient Reported Vitals       Row Name 09/09/23 0948                Weight: 186 kg (410 lb)        Height: 188 cm (6' 2)        Pain Score: Three        Pain Location: HEAD                       Hemoglobin   Date/Time Value Ref Range Status   03/27/2021 12:00 AM 15.9  Final     TSH   Date/Time Value Ref Range Status   03/27/2021 12:00 AM  2.79  Final     Creatinine   Date/Time Value Ref Range Status   10/04/2022 12:00 AM 0.84  Final         DIAGNOSTIC  Sleep study: Mild sleep apnea, AHI 6.8 per hour, no significant nocturnal hypoxemia (March 2024)           Physical Exam  Physical exam limited due to telemedicine encounter. Patient is alert and oriented, not in any distress, breathing does not appear labored, making good eye contact, speech is comprehensible, mood appropriate, no agitation, thought content appears to be normal, answering questions appropriately.        Assessment and Plan:  Problem List          ENDOCRINE AND METABOLIC    Morbid obesity (HCC)           SLEEP    OSA (obstructive sleep apnea)        Excessive daytime sleepiness             Mild Obstructive Sleep Apnea  Diagnosed via sleep study in March 2024 with an AHI of 6.8/hour and no significant nocturnal hypoxemia. Reports persistent daytime fatigue and chronic headaches, currently under neurologist evaluation. Current CPAP machine is over 87 years old with about six residual episodes per hour. Desires a new CPAP machine and mask fitting. Discussed good sleep hygiene, increased physical activity, and the risk of driving when sleepy. Explained CPAP may not fully resolve headaches, which could have neurological causes.  - Order new CPAP machine through a new DME company  - Set CPAP settings to 8-20 cm H2O  - Schedule follow-up compliance visit with sleep clinic  - Counsel on good sleep hygiene practices  - Advise to avoid driving when feeling sleepy  - Encourage daily physical activity, such as walking    General Health Maintenance  Discussed maintaining a consistent bedtime and wake-up routine, avoiding early bedtimes, and staying out of bed when not sleeping. Emphasized physical activity benefits for overall health and sleep quality.  - Encourage following a consistent bedtime and wake-up routine  - Advise to avoid going to bed too early  - Recommend staying out of bed during the day when not sleeping  - Encourage increasing physical activity levels    Follow-up  - Follow up after new CPAP machine is obtained.         Patient was also counseled on good sleep hygiene, non-supine sleep, avoidance of alcohol before bedtime, regular exercise as tolerated and weight loss to target body weight. Cautioned on the risks of operating motor vehicle or any heavy machinery when sleepy and advised against drowsy driving.   Ample opportunity was provided for questions and concerns. Patient is aware they can contact our office via phone or mychart for any further queries.        Thank you for the opportunity to participate in the care of this patient. We will continue to follow them in Sleep Medicine clinic. Return in about 3 months (around 12/07/2023) for Telehealth, In-Person, with sleep APP Herma Ard NP.    Total Time Today was 41 minutes in the following activities: Preparing to see the patient, Obtaining and/or reviewing separately obtained history, Performing a medically appropriate examination and/or evaluation, Counseling and educating the patient/family/caregiver, Documenting clinical information in the electronic or other health record, and communicating results to the patient/family/caregiver and Care coordination.    Sincerely,  Asencion Islam, MD  Sleep Medicine   Assistant Professor   [employer]

## 2023-09-11 ENCOUNTER — Encounter: Admit: 2023-09-11 | Discharge: 2023-09-11 | Payer: 59

## 2023-09-11 MED ORDER — ROSUVASTATIN 10 MG PO TAB
10 mg | ORAL_TABLET | Freq: Every day | ORAL | 0 refills | 90.00000 days | Status: AC
Start: 2023-09-11 — End: ?

## 2023-11-16 ENCOUNTER — Encounter: Admit: 2023-11-16 | Discharge: 2023-11-16 | Payer: PRIVATE HEALTH INSURANCE

## 2023-11-16 MED ORDER — OLMESARTAN-HYDROCHLOROTHIAZIDE 20-12.5 MG PO TAB
1 | ORAL_TABLET | Freq: Every day | ORAL | 3 refills | Status: AC
Start: 2023-11-16 — End: ?

## 2023-11-17 ENCOUNTER — Encounter: Admit: 2023-11-17 | Discharge: 2023-11-17 | Payer: PRIVATE HEALTH INSURANCE

## 2023-11-19 ENCOUNTER — Encounter: Admit: 2023-11-19 | Discharge: 2023-11-19 | Payer: PRIVATE HEALTH INSURANCE

## 2023-12-22 ENCOUNTER — Encounter: Admit: 2023-12-22 | Discharge: 2023-12-22 | Payer: PRIVATE HEALTH INSURANCE

## 2023-12-30 ENCOUNTER — Encounter: Admit: 2023-12-30 | Discharge: 2023-12-30 | Payer: PRIVATE HEALTH INSURANCE

## 2024-01-08 ENCOUNTER — Encounter: Admit: 2024-01-08 | Discharge: 2024-01-08 | Payer: PRIVATE HEALTH INSURANCE

## 2024-01-15 ENCOUNTER — Encounter: Admit: 2024-01-15 | Discharge: 2024-01-15 | Payer: PRIVATE HEALTH INSURANCE

## 2024-01-15 ENCOUNTER — Ambulatory Visit: Admit: 2024-01-15 | Discharge: 2024-01-16 | Payer: PRIVATE HEALTH INSURANCE

## 2024-02-04 ENCOUNTER — Encounter: Admit: 2024-02-04 | Discharge: 2024-02-04 | Payer: PRIVATE HEALTH INSURANCE

## 2024-02-04 ENCOUNTER — Ambulatory Visit: Admit: 2024-02-04 | Discharge: 2024-02-04 | Payer: PRIVATE HEALTH INSURANCE

## 2024-02-09 ENCOUNTER — Encounter: Admit: 2024-02-09 | Discharge: 2024-02-09 | Payer: PRIVATE HEALTH INSURANCE

## 2024-02-09 ENCOUNTER — Ambulatory Visit: Admit: 2024-02-09 | Discharge: 2024-02-09 | Payer: PRIVATE HEALTH INSURANCE

## 2024-02-09 DIAGNOSIS — M5416 Radiculopathy, lumbar region: Principal | ICD-10-CM

## 2024-02-09 MED ORDER — DEXAMETHASONE SODIUM PHOS (PF) 10 MG/ML IJ EPIDURAL SOLN
15 mg | Freq: Once | EPIDURAL | 0 refills | Status: CP
Start: 2024-02-09 — End: ?

## 2024-02-09 MED ORDER — LIDOCAINE (PF) 10 MG/ML (1 %) IJ SOLN
6 mL | Freq: Once | INTRAMUSCULAR | 0 refills | Status: CP
Start: 2024-02-09 — End: ?

## 2024-02-09 MED ORDER — IOHEXOL 300 MG IODINE/ML IV SOLN
1 mL | Freq: Once | 0 refills | Status: CP
Start: 2024-02-09 — End: ?

## 2024-02-09 NOTE — Procedures
 Attending Surgeon: Rupert Donovan, MD    Anesthesia: Local      Transforaminal Lumbar/Sacral Therapeutic  Procedure: transforaminal epidural    Laterality: bilateral   on 02/09/2024 7:30 AM  Location: lumbar - L4-5      Consent:   Consent obtained: written  Consent given by: patient  Risks discussed: allergic reaction, bleeding, infection, nerve damage, no change or worsening in pain, reaction to medication, seizure and weakness  Alternatives discussed: alternative treatment, delayed treatment, no treatment and referral  Discussed with patient the purpose of the treatment/procedure, other ways of treating my condition, including no treatment/ procedure and the risks and benefits of the alternatives. Patient has decided to proceed with treatment/procedure.        Universal Protocol:  Relevant documents: relevant documents present and verified  Test results: test results available and properly labeled  Imaging studies: imaging studies available  Required items: required blood products, implants, devices, and special equipment available  Site marked: the operative site was marked  Patient identity confirmed: Patient identify confirmed verbally with patient.        Time out: Immediately prior to procedure a time out was called to verify the correct patient, procedure, equipment, support staff and site/side marked as required      Procedures Details:   Indications: pain   Patient position: prone  Number of Levels: 1  Guidance: fluoroscopy  Needle and Epidural Catheter: quincke  Needle size: 25 G  Patient tolerance: Patient tolerated the procedure well with no immediate complications. Pressure was applied, and hemostasis was accomplished.  Comments:   LUMBAR/SACRAL TRANSFORAMINAL EPIDURAL STEROID INJECTION PROCEDURE:    1) bilateral L4-5 transforaminal epidural steroid injection  2) Fluoroscopic needle guidance    REASON FOR PROCEDURE: Lumbar radiculopathy    PHYSICIAN: Rupert Donovan, MD    MEDICATIONS INJECTED: 0.75 mL of Dexamethasone  (7.5 mg) + 0.75 ml lidocaine  1% at each level    LOCAL ANESTHETIC INJECTED: 1 mL of 1% lidocaine  per site    SEDATION MEDICATIONS: None    ESTIMATED BLOOD LOSS: None    SPECIMENS REMOVED: None    COMPLICATIONS: None    TECHNIQUE: Time-out was taken to identify the correct patient, procedure and side prior to starting the procedure. Lying in a prone position, the patient was prepped and draped in the usual sterile fashion using DuraPrep and a fenestrated drape. The area to be injected was determined under fluoroscopic guidance. Local anesthetic was given by raising a skin wheal and going down to the hub of a 27-gauge 1.25-inch needle.     The 25-gauge Quincke needle was advanced toward the 6 o'clock position of the pedicle at each above-named nerve root level. The needle was advanced to the final position via a combination of AP and oblique lateral fluoroscopic intermittent image. Contrast was injected. Review with fluoroscopy showed epidural spread and there was no vascular runoff. After a negative aspiration, the medication was then injected.     The procedure was completed without complications and was tolerated well. The patient was monitored after the procedure. The patient (or responsible party) was given post-procedure and discharge instructions to follow at home. The patient was discharged in stable condition.     This patient's clinical history, exam, AND imaging support radiculopathy AND there is a significant impact on quality of life and function AND the pain has been present for at least 4 weeks AND they have failed to improve with noninvasive conservative care.        Estimated  blood loss: none or minimal  Specimens: none  Patient tolerated the procedure well with no immediate complications. Pressure was applied, and hemostasis was accomplished.  Administrations This Visit       dexamethasone  PF (DECADRON ) epidural injection 15 mg       Admin Date  02/09/2024 Action  Given Dose  15 mg Route  Epidural Documented By  Landy Printers, RN              iohexoL  (OMNIPAQUE -300) 300 mg/mL injection 1 mL       Admin Date  02/09/2024 Action  Given Dose  1 mL Route  SEE ADMIN INSTRUCTIONS Documented By  Landy Printers, RN              lidocaine  PF 1% (10 mg/mL) injection 6 mL       Admin Date  02/09/2024 Action  Given Dose  6 mL Route  Injection Documented By  Landy Printers, RN

## 2024-02-09 NOTE — Discharge Instructions - Supplementary Instructions
 GENERAL POST PROCEDURE INSTRUCTIONS  Physician: _________________________________  Procedure Completed Today:  Joint Injection (hip, knee, shoulder)  Cervical Epidural Steroid Injection  Cervical Transforaminal Steroid Injection  Trigger Point Injection  Caudal Epidural Steroid Injection  Piriformis Injection  Pudendal Nerve Block  Other _____________________ Thoracic Epidural Steroid Injection  Lumbar Epidural Steroid Injection  Lumbar Transforaminal Steroid Injection  Facet Joint Injection  Celiac Nerve Block  Sacrococcygeal  Sacroiliac Joint Injection   Important information following your procedure today:  You may drive today     If you had sedation, you may NOT drive today  Rest at home for the next 6 hours.  You may then begin to resume your normal activities.  DO NOT drive any vehicle, operate any power tools, drink alcohol, make any major decisions, or sign any legal documents for the next 12 hours.  Pain relief may not be immediate. It is possible you may even experience an increase in pain during the first 24-48 hours followed by a gradual decrease of your pain.  Though the procedure is generally safe, and complications are rare, we do ask that you be aware of any of the following:  Any swelling, persistent redness, new bleeding or drainage from the site of the injection.  You should not experience a severe headache.  You should not run a fever over 101oF.  New onset of sharp, severe back and or neck pain.  New onset of upper or lower extremity numbness or weakness.  New difficulty controlling bowel or bladder function after injection.  New shortness of breath.  ** If any of these occur, please call to report this occurrence to the nurse of Dr. Lourdes Sledge at [phone]. If you are calling after 4:00 p.m. or on weekends or holidays, please call [phone] and ask to have the resident physician on call for the physician paged or go to your local emergency room.  You may experience soreness at the injection site. Ice can be applied at 20-minute intervals for the first 24 hours. The following day you may alternate ice with heat if you are experiencing muscle tightness, otherwise continue with ice. Ice works best at decreasing pain. Avoid application of direct heat, hot showers or hot tubs today.  Avoid strenuous activity today. You many resume your regular activities and exercise tomorrow.  Patients with diabetes may see an elevation in blood sugars for 7-10 days after the injection. It is important to pay close attention to your diet, check your blood sugars daily and report extreme elevations to the physician that manages your diabetes.  Patients taking daily blood thinners can resume their regular dose this evening.  It is important that you take all medications ordered by your pain physician. Taking medications as ordered is an important part of your pain care plan. If you cannot continue the medication plan, please notify the physician.    Possible side effects to steroids that may occur:  Flushing or redness of the face  Irritability  Fluid retention  Change in women's menses  Minor headache    If you are unable to keep your upcoming appointment, please notify the [facility] scheduler at [phone] at least 24 hours in advance. If you have questions for the surgery center, call [facility] at [phone].

## 2024-02-09 NOTE — Progress Notes
 I have examined the patient, and there are no significant changes in their condition, from the previous H&P performed on 01/15/24.    Bilateral L4-5 TFESI        [facility]  NEW PATIENT HISTORY AND PHYSICAL  Subjective     Chief Complaint:   Chief Complaint   Patient presents with    Lower Back - Pain    New Patient     Previously saw Dr. Lazarus, Lumbar Back Pain         HPI: George Rodgers is a 48 y.o. male who  has a past medical history of Anxiety disorder, Degenerative disc disease, lumbar (2022), Depression (08/2019), Dizziness (11/2021), High cholesterol (08/2021), Joint pain (2017), Low back pain, Morbid obesity (CMS-HCC) (11/08/2021), Primary hypertension (11/08/2021), Sleep apnea (2021), and Spinal headache (08/05/2023). who presents for evaluation. Low back pain going into both hips. Last injection did help for a few months at 50% relief. Slowly came back. Both sides with some numbness into the feet with aching and burning to the knees. On the outside of the legs and over the top of the foot. Occassionally with sharp pains R>L. Feels like at times he can't pick his feet up. Walking makes it worse, standing, twisting, bending. Avoiding activity helps. At its worst pain is 10 and at its best is 3.    Constant ache in the neck. Feels stiff and feels like range of motion is decreased. Pain goes into the L>R shoulder. Pain goes into the hands with tingling. Mostly pinky fingers affected with numbness. Mild weakness L>R in the entire arm. Burning in the shoulder into the L arm. 10 at its worst 3 at its best.       The pain is in the (Patient-Rptd) Lower back      Pain started: (Patient-Rptd) Greater than 1 year      Initial inciting injury or event: (Patient-Rptd) No      Numbness/tingling: (Patient-Rptd) Leg      The pain averages 3-10/10    The pain is described as (Patient-Rptd) Aching, Stabbing, Nagging      The pain is exacerbated by (Patient-Rptd) Stand, Walk, Bend        The pain is partially alleviated by (Patient-Rptd) Rest, Sitting down, Medications        Quintarius Ferns denies any recent fevers, chills, infection, antibiotics, bowel or bladder incontinence, saddle anesthesia, or bleeding issues.        PRIOR MEDICATIONS:   Effective  Lyrica   L4-5 ILESI  2021  Duloxetine  Amitryptiline    Ineffective  Gabapentin     Unable to tolerate      Never  NSAID  Acetaminophen     PRIOR INTERVENTIONS:   Effective      Ineffective      ROS: All 14 systems reviewed and found to be negative except as above and as follows. +joint pain, OSA    Past Medical History:  Past Medical History:    Anxiety disorder    Degenerative disc disease, lumbar    Depression    Dizziness    High cholesterol    Joint pain    Low back pain    Morbid obesity (CMS-HCC)    Primary hypertension    Sleep apnea    Spinal headache       Family History:  Family History   Problem Relation Name Age of Onset    Arthritis Mother Luke     Back  pain Mother Luke     Hypertension Mother Luke     Joint Pain Mother Kim     Coronary Artery Disease Father Boniface     Diabetes Father Aadhav     Heart problem Father Ajai     Hypertension Father Mars     Heart Surgery Father Ayman     Heart Attack Father Dquan     Arthritis Mother Kim     Back pain Mother Luke     Hypertension Mother Kim     Joint Pain Mother Kim     Diabetes Father Moody     Heart problem Father Jhayden     Hypertension Father Aldrick     Heart Attack Father Montavis     Heart Surgery Father Wayde        Social History:  Lives in [place] [id number]    Social History     Socioeconomic History    Marital status: Married   Tobacco Use    Smoking status: Never    Smokeless tobacco: Never   Vaping Use    Vaping status: Never Used   Substance and Sexual Activity    Alcohol use: Yes     Alcohol/week: 1.0 standard drink of alcohol     Types: 1 Cans of beer per week     Comment: Rarely just on special occasions    Drug use: Never    Sexual activity: Yes     Partners: Female Birth control/protection: I.U.D., None       Allergies:  No Known Allergies    Medications:    Current Outpatient Medications:     busPIRone (BUSPAR) 10 mg tablet, Take one tablet by mouth twice daily., Disp: , Rfl:     duloxetine DR (CYMBALTA) 60 mg capsule, Take two capsules by mouth at bedtime daily., Disp: , Rfl:     meclizine (ANTIVERT) 25 mg tablet, Take one tablet by mouth as Needed., Disp: , Rfl:     meloxicam (MOBIC) 7.5 mg tablet, Take one tablet by mouth twice daily., Disp: , Rfl:     olmesartan -hydroCHLOROthiazide  (BENICAR  HCT) 20-12.5 mg tablet, Take one tablet by mouth daily., Disp: 90 tablet, Rfl: 3    pregabalin  (LYRICA ) 150 mg capsule, Take one capsule by mouth three times daily., Disp: , Rfl:     rizatriptan (MAXALT-MLT) 10 mg rapid dissolve tablet, Dissolve one tablet by mouth as Needed., Disp: , Rfl:     rosuvastatin  (CRESTOR ) 10 mg tablet, Take one tablet by mouth daily., Disp: 90 tablet, Rfl: 0    tiZANidine (ZANAFLEX) 4 mg tablet, Take one tablet by mouth at bedtime as needed., Disp: 120 tablet, Rfl: 1    Physical examination:   BP 125/66  - Pulse 72  - Ht 188 cm (6' 2)  - Wt (!) 199.6 kg (440 lb)  - SpO2 98%  - BMI 56.49 kg/m?   Pain Score: Eight    General: The patient is a well-developed, well nourished 48 y.o. male in no acute distress.   HEENT: Head is normocephalic and atraumatic. EOMI.  Pulmonary: The patient has unlabored respirations and bilateral symmetric chest excursion.     Neurologic:   The patient is alert and oriented times 3.   Cranial nerves II through XII are intact without any focal deficits.     Musculoskeletal:   Gait is normal.    Cervical spine:  Cervical tenderness: Yes  Pain with extension: Yes  Pain with lateral flexion: Bilateral  Limited  neck ROM: Yes  Sensation to light touch - left upper extremity: diminished  Sensation to light touch - right upper extremity: intact  Hoffman sign: Deferred    Upper extremity strength:   Root Right Left   Shoulder Abduction C5 5 5   Elbow Flexion C5 5 5   Elbow Extension C7 5 5   Wrist Extension C6 5 5   Finger Flexion C8 5 5   Finger Abduction T1 5 5           Lumbar spine:  Appearance: No lesions or deformity  Lumbar tenderness: Yes  SI joint tenderness: Negative bilaterally  Pain with extension: Yes  Pain with lateral flexion: None  Sensation to light touch - left lower extremity: diminished lateral portion of thigh and calf with anterior foot  Sensation to light touch - right lower extremity: intact  Straight leg raise: Positive bilaterally  FABER test: Negative bilaterally  FADIR test: Negative bilaterally  Lateral pelvic compression: Deferred      Lower extremity strength:   Root Right Left   Hip Flexion L2 5 5   Knee Flexion L5/S1 5 5   Knee Extension L3 5 5   Dorsiflexion L4 5 5   Plantarflexion S1 5 5   EHL Extension L5 5 5         MRI C-Spine Results:  INDICATION: Neck Pain     TECHNIQUE: Five views of the cervical spine were submitted. AP, lateral, swimmer's and lateral views with flexion and extension were obtained.       COMPARISON: None available.     FINDINGS:     Alignment and curvature are normal.     Vertebral body heights  are preserved.     Prevertebral soft tissues are within normal limits for thickness.     There is mild disc space narrowing at C4-5 and C5-6.     Normal alignment is maintained with flexion and extension.     IMPRESSION:     Very mild degenerative changes are present at C4-5 and C5-6. No acute abnormality is identified. There is no instability.       MRI L-Spine Results:      Results for orders placed during the hospital encounter of 08/30/19    MR Lumbar w/ & w/o IV contrast     INDICATION: Lower back pain, bladder incontinence post epidural     TECHNIQUE: T1, fast spin echo and T2, and inversion recovery sagittal images of the lumbar spine were obtained along with T1, sagittal and axial post contrast fat-sat images.     COMPARISON: Same-day outside CT from [place], MRI 01/29/2017     FINDINGS: Clumping of the nerve roots in the lumbar spine to the level of L4. This is new from prior outside MRI from 2019. No epidural abscess. Likely inflammation of the surrounding fat with somewhat heterogeneous appearance in the upper to mid lumbar spine. 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 There is no abnormal cord enhancement. There is a facet cyst at L4-L5 on the left with mild rim enhancement measuring 1.1 cm on series 16 image 71, likely inflamed cyst.     No disc protrusion causing significant central canal narrowing.     T12-L1: Patent neural foramen.     L1-L2: Patent neural foramen.     L2-L3: Patent neural foramen.     L3-L4: Patent neural foramen.     L4-L5: Minimal grade 1 anterolisthesis of L4 on L5. Moderate right and mild  left neural foraminal narrowing.     L5-S1: Patent neural foramen.     No acute findings in the extra vertebral soft tissues.         Results for orders placed during the hospital encounter of 08/30/19    MRI T-SPINE WO CONTRAST    Narrative  EXAM: MRI T and L spine    HISTORY: fall, tingling in legs, concern for occult fx.    Technique: Multiple sagittal and axial MR sequences were obtained of the thoracic and lumbar spine without IV contrast.    Comparison: L-spine radiograph August 30, 2019. CT T and L-spine August 12, 2019.    FINDINGS:    Thoracic spine:    Exaggerated thoracic kyphosis with otherwise normal alignment. Diffuse T7 and T11 vertebral body marrow edema with increased anterior wedging vertebral compression deformity at T7 and development of a new compression deformity at T11, with linear hypointense fracture lines demonstrated at both levels and both demonstrating approximately 30-40% height loss. Rounded STIR and T1 hyperintensity within the T12 vertebral body consistent with a hemangioma. Stable mild T8, T9, and T10 anterior wedging compression fracture deformities with most pronounced mild associated edema anteriorly, likely related to chronic fracture and/or degenerative edema.    Mild to moderate diffuse disc degeneration. Mild broad-based posterior disc bulging at T6-T7, T7-T8 and T11-T12 with mild effacement of the ventral thecal sac. No significant central spinal stenosis. Disc degeneration and facet arthrosis results in up to mild neural foraminal stenosis, most pronounced at T9-T10 on the left. There is chronic anterior wedging deformities of the T9-T10 and T11 vertebral bodies. The thoracic cord is normal in size and signal. Small STIR hyperintense lesion within the lower thoracic posterior subcutaneous fat, not demonstrated on CT, likely a vascular lesion or benign cyst.    Lumbar spine:    5 nonrib-bearing lumbar type vertebral bodies. There is normal lumbar alignment. The vertebral body heights are maintained. There is normal marrow signal.  The conus is normal in position and appearance at the L1 level. There is mild desiccation of the L3-L4 disc with small posterior annular fissure and mildly edematous inferior Schmorl's node deformity. Mild symmetric disc bulges at L3-L4 and L4-L5 with minimal associated bilateral lateral recess and neural foraminal stenosis. No significant central spinal stenosis. The paraspinous soft tissues are unremarkable.    Impression  Thoracic spine:    1.  Acute-appearing compression deformities involving the T7 and T11 vertebral bodies with 30-40% height loss and diffuse marrow edema. Subacute to chronic appearing T8-T10 anterior wedging compression fracture deformities.  2.  No retropulsion, traumatic subluxation, or intraspinal hemorrhage.  3.  Mild thoracic spondylosis resulting in multilevel mild neural foraminal stenosis without significant central spinal stenosis.    Lumbar spine    1. No acute fracture or subluxation of the lumbar spine.  2. Mild multilevel lumbar spondylosis with small posterior disc bulges at L3-L4 and L4-L5 resulting in minimal lateral recess and neural foraminal stenosis.    By my electronic signature, I attest that I have personally reviewed the images for this examination and formulated the interpretations and opinions expressed in this report      Finalized by Jayden Spencer, M.D. on 08/30/2019 9:01 PM. Dictated by Thresa Sharps, MD on 08/30/2019 8:03 PM.         Last Cr and LFT's:  Creatinine   Date Value Ref Range Status   10/04/2022 0.84  Final     AST (SGOT)   Date Value Ref Range  Status   03/27/2021 16  Final     ALT (SGPT)   Date Value Ref Range Status   03/27/2021 32  Final     Alk Phosphatase   Date Value Ref Range Status   03/27/2021 102  Final     Total Bilirubin   Date Value Ref Range Status   03/27/2021 0.41  Final          Assessment:    Neiman Roots is a 48 y.o. male who  has a past medical history of Anxiety disorder, Degenerative disc disease, lumbar (2022), Depression (08/2019), Dizziness (11/2021), High cholesterol (08/2021), Joint pain (2017), Low back pain, Morbid obesity (CMS-HCC) (11/08/2021), Primary hypertension (11/08/2021), Sleep apnea (2021), and Spinal headache (08/05/2023). who presents for evaluation of pain.    The pain complaints are most likely due to:    1. Lumbar radiculopathy, chronic  MRI C-SPINE WO CONTRAST    [place] [facility] TRANSFORAMINAL LUMBAR/SACRAL THERAPEUTIC    [place] [facility] INJECT INTERLAM CRV/THRC      2. Cervical radiculopathy  MRI C-SPINE WO CONTRAST    [facility] [facility] TRANSFORAMINAL LUMBAR/SACRAL THERAPEUTIC    [facility] INJECT INTERLAM CRV/THRC      3. Muscle spasm        4. Migraine aura, persistent, intractable            Patient has had an adequate trial of rest, exercise, multimodal treatment, and the passage of time without improvement of symptoms. The pain has significant impact on the daily quality of life.     Plan:    Will schedule for L4-5 bilateral TFESI at first available appointment.   Plan for C7-T1 ILESI at next available appointment.   Tizanidine 2-4mg  QHS PRN for muscle spasm  Will obtain MRI C-spine wo contrast  Continue lifestyle modifications  Follow up after procedure    Risks/benefits of all pharmacologic and interventional treatments discussed and questions answered.     Thank you for this kind referral for consultation. Please feel free to contact me with any questions or concerns.          Comer Clause, MD  Anesthesiology PGY-2        ATTESTATION    I personally performed the key portions of the E/M visit, discussed case with resident and concur with resident documentation of history, physical exam, assessment, and treatment plan unless otherwise noted.    Staff name:  Rupert Donovan, MD Date: 01/19/2024

## 2024-02-15 ENCOUNTER — Encounter: Admit: 2024-02-15 | Discharge: 2024-02-15 | Payer: PRIVATE HEALTH INSURANCE

## 2024-02-23 ENCOUNTER — Encounter: Admit: 2024-02-23 | Discharge: 2024-02-23 | Payer: PRIVATE HEALTH INSURANCE

## 2024-02-23 ENCOUNTER — Ambulatory Visit: Admit: 2024-02-23 | Discharge: 2024-02-23 | Payer: PRIVATE HEALTH INSURANCE

## 2024-02-23 DIAGNOSIS — M5412 Radiculopathy, cervical region: Principal | ICD-10-CM

## 2024-02-23 MED ORDER — LIDOCAINE (PF) 10 MG/ML (1 %) IJ SOLN
2 mL | Freq: Once | INTRAMUSCULAR | 0 refills | Status: CP
Start: 2024-02-23 — End: ?

## 2024-02-23 MED ORDER — SODIUM CHLORIDE 0.9 % IJ SOLN
3 mL | Freq: Once | INTRAMUSCULAR | 0 refills | Status: CP
Start: 2024-02-23 — End: ?

## 2024-02-23 MED ORDER — TRIAMCINOLONE ACETONIDE 40 MG/ML IJ SUSP
80 mg | Freq: Once | EPIDURAL | 0 refills | Status: CP
Start: 2024-02-23 — End: ?

## 2024-02-23 MED ORDER — IOHEXOL 300 MG IODINE/ML IV SOLN
1 mL | Freq: Once | 0 refills | Status: CP
Start: 2024-02-23 — End: ?

## 2024-02-23 NOTE — Procedures
 Attending Surgeon: Rupert Donovan, MD    Anesthesia: Local    Pre-Procedure Diagnosis:   1. Cervical radiculopathy        Post-Procedure Diagnosis:   1. Cervical radiculopathy             ESI CRV/THRC  Procedure: epidural - interlaminar    Laterality: n/a   on 02/23/2024 7:30 AM  Location: cervical ESI with imaging - C7-T1      Consent:   Consent obtained: written  Consent given by: patient  Risks discussed: allergic reaction, bleeding, bruising, infection, never damage, no change or worsening in pain, pneumo thorax, reaction to medication, seizure, swelling and weakness  Alternatives discussed: alternative treatment, delayed treatment and no treatment  Discussed with patient the purpose of the treatment/procedure, other ways of treating my condition, including no treatment/ procedure and the risks and benefits of the alternatives. Patient has decided to proceed with treatment/procedure.        Universal Protocol:  Relevant documents: relevant documents present and verified  Site marked: the operative site was marked  Patient identity confirmed: Patient identify confirmed verbally with patient.        Time out: Immediately prior to procedure a time out was called to verify the correct patient, procedure, equipment, support staff and site/side marked as required      Procedures Details:   Indications: pain   Prep: chlorhexidine  Patient position: prone  Estimated Blood Loss: minimal  Specimens: none  Number of Levels: 1  Approach: midline  Guidance: fluoroscopy  Contrast: Procedure confirmed with contrast under live fluoroscopy.  Needle and Epidural Catheter: tuohy  Needle size: 18 G  Injection procedure: Incremental injection and Negative aspiration for blood  Amount Injected:   C7-T1: 4mL  Patient tolerance: Patient tolerated the procedure well with no immediate complications. Pressure was applied, and hemostasis was accomplished.  Outcome: Pain unchanged  Comments:   CERVICAL INTERLAMINAR EPIDURAL STEROID INJECTION    PROCEDURE:  1) C7-T1 interlaminar epidural steroid injection  2) Fluoroscopic needle guidance    REASON FOR PROCEDURE: Cervical radiculopathy    ATTENDING PHYSICIAN: Rupert Donovan, MD    MEDICATIONS INJECTED: 2 mL of triamciniolone (80 mg) and 2 mL of sterile, preservative-free normal saline    LOCAL ANESTHETIC INJECTED: 1 mL of 1% lidocaine     SEDATION MEDICATIONS: None    ESTIMATED BLOOD LOSS: None    SPECIMENS REMOVED: None    COMPLICATIONS: None    TECHNIQUE: Time-out was taken to identify the correct patient, procedure and side prior to starting the procedure. With the patient lying in a prone position with the neck in a slightly  flexed position, the area was prepped and draped in sterile fashion using DuraPrep and a fenestrated drape. The area was determined under fluoroscopic guidance. A 27-gauge, 1.25-inch  needle was used to anesthetize the needle entry site and subcutaneous tissues.     The 18-gauge, 3.5-inch Tuohy needle was advanced through the ligamentum flavum using loss of resistance technique. Once the tip of the needle was thought to be in the desired position, contrast was injected to confirm only epidural spread and no vascular runoff via A-P and lateral  views. The injectate was then injected slowly with intermittent negative aspiration.    The procedure was completed without complications and was tolerated well. The patient was monitored after the procedure. The patient (or responsible party) was given post-procedure and  discharge instructions to follow at home. The patient was discharged in stable condition.  Rupert Donovan, MD, MBA      Administrations This Visit       iohexoL  (OMNIPAQUE -300) 300 mg/mL injection 1 mL       Admin Date  02/23/2024 Action  Given Dose  1 mL Route  SEE ADMIN INSTRUCTIONS Documented By  Maranda Remak, RN              lidocaine  PF 1% (10 mg/mL) injection 2 mL       Admin Date  02/23/2024 Action  Given Dose  2 mL Route  Injection Documented By  Maranda Remak, RN              sodium chloride  PF 0.9% injection 3 mL       Admin Date  02/23/2024 Action  Given Dose  3 mL Route  Injection Documented By  Maranda Remak, RN              triamcinolone  acetonide (KENALOG -40) injection 80 mg       Admin Date  02/23/2024 Action  Given Dose  80 mg Route  Epidural Documented By  Maranda Remak, RN                  Estimated blood loss: none or minimal  Specimens: none  Patient tolerated the procedure well with no immediate complications. Pressure was applied, and hemostasis was accomplished.

## 2024-02-23 NOTE — Progress Notes
 [facility]  INTERVENTIONAL PAIN PROCEDURE HISTORY AND PHYSICAL    Chief Complaint: Pain    HISTORY OF PRESENT ILLNESS:  Neck pain with UE radicular pain    Past Medical History:    Anxiety disorder    Degenerative disc disease, lumbar    Depression    Dizziness    High cholesterol    Joint pain    Low back pain    Morbid obesity (CMS-HCC)    Primary hypertension    Sleep apnea    Spinal headache       Surgical History:   Procedure Laterality Date    HX TONSILLECTOMY  1983    SURGERY  04/2020    Slap repair left shoulder       family history includes Arthritis in his mother and mother; Back pain in his mother and mother; Coronary Artery Disease in his father; Diabetes in his father and father; Heart Attack in his father and father; Heart Surgery in his father and father; Heart problem in his father and father; Hypertension in his father, father, mother, and mother; Joint Pain in his mother and mother.    Social History     Socioeconomic History    Marital status: Married   Tobacco Use    Smoking status: Never    Smokeless tobacco: Never   Vaping Use    Vaping status: Never Used   Substance and Sexual Activity    Alcohol use: Yes     Alcohol/week: 1.0 standard drink of alcohol     Types: 1 Cans of beer per week     Comment: Rarely just on special occasions    Drug use: Never    Sexual activity: Yes     Partners: Female     Birth control/protection: I.U.D., None       No Known Allergies    There were no vitals filed for this visit.          REVIEW OF SYSTEMS: 10 point ROS obtained and negative      PHYSICAL EXAM:    General: Alert, cooperative, no acute distress.  HEENT: Normocephalic, atraumatic.  Neck: Supple.  Lungs: Unlabored respirations, bilateral and equal chest excursion.  Heart: Regular rate.  Skin: Warm and dry to touch.  Abdomen: Nondistended.  MSK: No deformity.  Neurological: Alert and oriented x3.        IMPRESSION:    1. Cervical radiculopathy         PLAN: Cervical Epidural Steroid Injection C7-T1

## 2024-02-23 NOTE — Discharge Instructions - Supplementary Instructions
 GENERAL POST PROCEDURE INSTRUCTIONS  Physician: _________________________________  Procedure Completed Today:  Joint Injection (hip, knee, shoulder)  Cervical Epidural Steroid Injection  Cervical Transforaminal Steroid Injection  Trigger Point Injection  Caudal Epidural Steroid Injection  Piriformis Injection  Pudendal Nerve Block  Other _____________________ Thoracic Epidural Steroid Injection  Lumbar Epidural Steroid Injection  Lumbar Transforaminal Steroid Injection  Facet Joint Injection  Celiac Nerve Block  Sacrococcygeal  Sacroiliac Joint Injection   Important information following your procedure today:  You may drive today     If you had sedation, you may NOT drive today  Rest at home for the next 6 hours.  You may then begin to resume your normal activities.  DO NOT drive any vehicle, operate any power tools, drink alcohol, make any major decisions, or sign any legal documents for the next 12 hours.  Pain relief may not be immediate. It is possible you may even experience an increase in pain during the first 24-48 hours followed by a gradual decrease of your pain.  Though the procedure is generally safe, and complications are rare, we do ask that you be aware of any of the following:  Any swelling, persistent redness, new bleeding or drainage from the site of the injection.  You should not experience a severe headache.  You should not run a fever over 101oF.  New onset of sharp, severe back and or neck pain.  New onset of upper or lower extremity numbness or weakness.  New difficulty controlling bowel or bladder function after injection.  New shortness of breath.  ** If any of these occur, please call to report this occurrence to the nurse of Dr. Lourdes Sledge at [phone]. If you are calling after 4:00 p.m. or on weekends or holidays, please call [phone] and ask to have the resident physician on call for the physician paged or go to your local emergency room.  You may experience soreness at the injection site. Ice can be applied at 20-minute intervals for the first 24 hours. The following day you may alternate ice with heat if you are experiencing muscle tightness, otherwise continue with ice. Ice works best at decreasing pain. Avoid application of direct heat, hot showers or hot tubs today.  Avoid strenuous activity today. You many resume your regular activities and exercise tomorrow.  Patients with diabetes may see an elevation in blood sugars for 7-10 days after the injection. It is important to pay close attention to your diet, check your blood sugars daily and report extreme elevations to the physician that manages your diabetes.  Patients taking daily blood thinners can resume their regular dose this evening.  It is important that you take all medications ordered by your pain physician. Taking medications as ordered is an important part of your pain care plan. If you cannot continue the medication plan, please notify the physician.    Possible side effects to steroids that may occur:  Flushing or redness of the face  Irritability  Fluid retention  Change in women's menses  Minor headache    If you are unable to keep your upcoming appointment, please notify the [facility] scheduler at [phone] at least 24 hours in advance. If you have questions for the surgery center, call [facility] at [phone].

## 2024-02-29 ENCOUNTER — Encounter: Admit: 2024-02-29 | Discharge: 2024-02-29 | Payer: PRIVATE HEALTH INSURANCE

## 2024-03-01 ENCOUNTER — Encounter: Admit: 2024-03-01 | Discharge: 2024-03-01 | Payer: PRIVATE HEALTH INSURANCE

## 2024-03-03 ENCOUNTER — Encounter: Admit: 2024-03-03 | Discharge: 2024-03-03 | Payer: PRIVATE HEALTH INSURANCE

## 2024-03-16 ENCOUNTER — Encounter: Admit: 2024-03-16 | Discharge: 2024-03-16 | Payer: PRIVATE HEALTH INSURANCE

## 2024-03-29 ENCOUNTER — Encounter: Admit: 2024-03-29 | Discharge: 2024-03-29 | Payer: PRIVATE HEALTH INSURANCE

## 2024-04-20 ENCOUNTER — Encounter: Admit: 2024-04-20 | Discharge: 2024-04-20 | Payer: PRIVATE HEALTH INSURANCE

## 2024-05-05 ENCOUNTER — Encounter: Admit: 2024-05-05 | Discharge: 2024-05-05 | Payer: PRIVATE HEALTH INSURANCE

## 2024-05-16 ENCOUNTER — Encounter: Admit: 2024-05-16 | Discharge: 2024-05-16 | Payer: PRIVATE HEALTH INSURANCE

## 2024-05-16 ENCOUNTER — Ambulatory Visit: Admit: 2024-05-16 | Discharge: 2024-05-17 | Payer: PRIVATE HEALTH INSURANCE

## 2024-05-26 ENCOUNTER — Encounter: Admit: 2024-05-26 | Discharge: 2024-05-26 | Payer: PRIVATE HEALTH INSURANCE

## 2024-06-02 ENCOUNTER — Encounter: Admit: 2024-06-02 | Discharge: 2024-06-02 | Payer: PRIVATE HEALTH INSURANCE

## 2024-06-02 ENCOUNTER — Ambulatory Visit: Admit: 2024-06-02 | Discharge: 2024-06-02 | Payer: PRIVATE HEALTH INSURANCE

## 2024-06-02 NOTE — Progress Notes [1]
 Department of Metabolic, Bariatric, and Minimally Invasive Surgery     Initial BMI 57.01, weight 444 lbs  Preoperative comorbidities: Hyperlipidemia, OSA, mild reflux, hypertension, back pain    Reason for Visit:  Possible weight loss surgery    HPI:  George Rodgers is a 48 y.o. male who reports he has struggled with his weight starting as a young person.  He has significant back issues as well as sciatica and is disabled so his weight has gotten worse with time.  Related to his obesity he has hypertension, sleep apnea, mild reflux, hyperlipidemia, and significant back issues.  He reports his reflux is very mild and easily controlled with an occasional medication.  He denies regurgitation.  He has never had an EGD.  He denies prior VTE.    PMH:  Past Medical History:    Anxiety disorder    Degenerative disc disease, lumbar    Depression    Dizziness    High cholesterol    Joint pain    Low back pain    Morbid obesity (CMS-HCC)    Primary hypertension    Sleep apnea    Spinal headache       PSH:  Surgical History:   Procedure Laterality Date    FRACTURE SURGERY  01/2021    Fractured my left shoulder along with a slap tear    HX TONSILLECTOMY  1983    SPINE SURGERY  2021    [facility]    SURGERY  04/2020    Slap repair left shoulder         Current Medications:  Current Medications[1]    Allergies:  Allergies[2]    SHx:  Social History     Tobacco Use    Smoking status: Never    Smokeless tobacco: Never   Vaping Use    Vaping status: Never Used   Substance Use Topics    Alcohol use: Yes     Alcohol/week: 1.0 standard drink of alcohol     Types: 1 Cans of beer per week     Comment: Rarely just on special occasions    Drug use: Never       FHx:  Family History   Problem Relation Name Age of Onset    Arthritis Mother Luke     Back pain Mother Luke     Hypertension Mother Luke     Joint Pain Mother Kim     Cancer Mother Kim         Skin cancer    Coronary Artery Disease Father Klay     Diabetes Father Teige     Heart problem Father Areon     Hypertension Father [employer]     Heart Surgery Father Terran     Heart Attack Father Chaseton     Cancer Father Orrie         Skin cancer    Heart Disease Father Emett         39    Arthritis Mother Kim     Back pain Mother Luke     Hypertension Mother Kim     Joint Pain Mother Kim     Diabetes Father Salvator     Heart problem Father Nichlas     Hypertension Father Giovany     Heart Attack Father Hilton     Heart Surgery Father Coley        ROS:  Review of Systems    PE:  Vitals:    06/02/24 0940  BP: 129/72   BP Source: Arm, Right Upper   Pulse: 86   Temp: 36.7 ?C (98.1 ?F)   Resp: 19   SpO2: 98%   TempSrc: Temporal   PainSc: Zero   Weight: (!) 201.4 kg (444 lb)   Height: 188 cm (6' 2)         (!) 201.4 kg (444 lb)  Body mass index is 57.01 kg/m?SABRA  Physical Exam  HENT:      Head: Normocephalic.   Neck:      Thyroid: No thyromegaly.   Cardiovascular:      Rate and Rhythm: Normal rate and regular rhythm.      Heart sounds: Normal heart sounds.   Pulmonary:      Effort: Pulmonary effort is normal.      Breath sounds: Normal breath sounds.   Abdominal:      Palpations: Abdomen is soft. There is no mass.      Tenderness: There is no abdominal tenderness.   Musculoskeletal:         General: Normal range of motion.      Cervical back: Normal range of motion and neck supple.   Lymphadenopathy:      Cervical: No cervical adenopathy.   Skin:     General: Skin is warm.      Findings: No rash.   Neurological:      Mental Status: He is alert and oriented to person, place, and time.   Psychiatric:         Mood and Affect: Affect normal.          Lab/Radiology/Other Diagnostic Tests:  Lab Results   Component Value Date    HGB 15.9 03/27/2021    HCT 51.6 03/27/2021    PLTCT 269 03/27/2021    NEUT 78 (H) 08/30/2019    ANC 10.94 (H) 08/30/2019    ALC 1.88 08/30/2019    MONA 5 08/30/2019    AMC 0.74 08/30/2019    EOSA 2 08/30/2019    ABC 0.16 08/30/2019    MCV 92.6 03/27/2021    MCH 28.5 03/27/2021    MCHC 30.7 03/27/2021    MPV 8.3 08/30/2019    RDW 13.8 03/27/2021     Lab Results   Component Value Date    NA 139 10/04/2022    K 4.0 10/04/2022    CL 109 10/04/2022    CO2 21 10/04/2022    GAP 14 03/27/2021    BUN 13 10/04/2022    CR 0.84 10/04/2022    GLU 109 (H) 10/04/2022    CA 9.0 10/04/2022    ALBUMIN 3.5 03/27/2021    TOTBILI 0.41 03/27/2021     Lab Results   Component Value Date    AST 16 03/27/2021    ALT 32 03/27/2021    ALKPHOS 102 03/27/2021     Lab Results   Component Value Date    CHOL 151 10/04/2022    TRIG 85 10/04/2022    HDL 44 10/04/2022    LDL 89 10/04/2022    VLDL 25 03/27/2021     Lab Results   Component Value Date    TSH 2.79 03/27/2021     No results found for: HGBA1C    Assessment:  Morbid obesity failing medical management with co-morbidities hypertension, OSA, hyperlipidemia, mild reflux, and back issues.  He has tried multiple programs and unable to maintain long term weight loss      Plan:  After discussing surgical options he would  like to proceed with sleeve gastrectomy which I think is a good option.  Prior to proceeding he will need to have an EGD to make sure there is no contraindications.  He will get his nutritional labs, psychological evaluation, and dietitian evaluation.  The procedure of the gastric sleeve  was discussed in detail with the patient.  The risks and benefits were discussed  including but not limited to infection, bleeding, trocar injury, staple line breakdown, DVT, PE, death, requiring an open procedure, inadequate or excessive weight loss, nutritional issues including protein and vitamin deficiencies, persistent nausea,and gastroesophageal reflux. .I also discussed that it would be reasonable to expect to lose 60-80% of their excess weight with appropriate lifestyle changes longterm   His questions were answered to their satisfaction and at this time wish to proceed.               Retha CHRISTELLA Albert, MD                                                                            06/02/24 [1]   Current Outpatient Medications:     busPIRone (BUSPAR) 10 mg tablet, Take one tablet by mouth twice daily., Disp: , Rfl:     duloxetine DR (CYMBALTA) 60 mg capsule, Take two capsules by mouth at bedtime daily., Disp: , Rfl:     meclizine (ANTIVERT) 25 mg tablet, Take one tablet by mouth as Needed., Disp: , Rfl:     meloxicam (MOBIC) 7.5 mg tablet, Take one tablet by mouth twice daily., Disp: , Rfl:     olmesartan -hydroCHLOROthiazide  (BENICAR  HCT) 20-12.5 mg tablet, Take one tablet by mouth daily., Disp: 90 tablet, Rfl: 3    pregabalin  (LYRICA ) 150 mg capsule, Take one capsule by mouth three times daily., Disp: , Rfl:     rizatriptan (MAXALT-MLT) 10 mg rapid dissolve tablet, Dissolve one tablet by mouth as Needed., Disp: , Rfl:     rosuvastatin  (CRESTOR ) 10 mg tablet, Take one tablet by mouth daily., Disp: 90 tablet, Rfl: 0    tiZANidine (ZANAFLEX) 4 mg tablet, Take one tablet by mouth at bedtime as needed., Disp: 120 tablet, Rfl: 1  [2] No Known Allergies

## 2024-06-05 ENCOUNTER — Encounter: Admit: 2024-06-05 | Discharge: 2024-06-05 | Payer: PRIVATE HEALTH INSURANCE

## 2024-06-09 ENCOUNTER — Encounter: Admit: 2024-06-09 | Discharge: 2024-06-09 | Payer: PRIVATE HEALTH INSURANCE

## 2024-06-09 ENCOUNTER — Ambulatory Visit: Admit: 2024-06-09 | Discharge: 2024-06-10 | Payer: PRIVATE HEALTH INSURANCE

## 2024-06-15 ENCOUNTER — Encounter: Admit: 2024-06-15 | Discharge: 2024-06-15 | Payer: PRIVATE HEALTH INSURANCE

## 2024-06-16 ENCOUNTER — Encounter: Admit: 2024-06-16 | Discharge: 2024-06-16 | Payer: PRIVATE HEALTH INSURANCE

## 2024-06-23 ENCOUNTER — Encounter: Admit: 2024-06-23 | Discharge: 2024-06-23 | Payer: PRIVATE HEALTH INSURANCE

## 2024-06-23 ENCOUNTER — Ambulatory Visit: Admit: 2024-06-23 | Discharge: 2024-06-24 | Payer: PRIVATE HEALTH INSURANCE

## 2024-06-23 NOTE — Progress Notes [1]
 Clinical Nutrition Note    George Rodgers is a 48 y.o. male with history of obesity proceeding bariatric surgery. Patient was seen in the context of a patient pre-op nutrition group telehealth class to review bariatric surgery nutrition requirements.     Provided nutrition education for pre- and post-bariatric surgery:  Ten day pre-op diet; diet/lifestyle habits to practice now to prepare for surgery  Six week post-op diet advancement; stages and allowed foods  Protein requirements; protein shake/powder recommendations  Discussed the bariatric plate for proper portioning, meal planning and choosing high quality foods.  Recommendations for avoidance of dumping syndrome, dehydration, nausea/vomiting, and constipation  No fluids with meals, avoid concentrated sweets and carbonation  Mindful eating practices such as chewing well, small bites, slow eating, physical vs emotional hunger  Food label reading and healthy cooking tips  Recommended bariatric vitamin/mineral supplements   Importance of exercise, consistent meal intakes, mindset support    Group session included time for patient questions and individual nutrition counseling sessions were offered. Patient received written materials on topics discussed as well as dietitian contact info.    RD to follow up at 6 weeks post-op or PRN.      Mandee Pluta, RD,CSOWM, LD, NASM-CPT   Available by: Debi

## 2024-07-20 ENCOUNTER — Encounter: Admit: 2024-07-20 | Discharge: 2024-07-20 | Payer: PRIVATE HEALTH INSURANCE

## 2024-07-21 ENCOUNTER — Encounter: Admit: 2024-07-21 | Discharge: 2024-07-21 | Payer: PRIVATE HEALTH INSURANCE

## 2024-07-21 ENCOUNTER — Ambulatory Visit: Admit: 2024-07-21 | Discharge: 2024-07-22 | Payer: PRIVATE HEALTH INSURANCE

## 2024-07-21 NOTE — Progress Notes [1]
 Clinical Nutrition Note     George Rodgers is a 48 y.o. male with obesity preparing for Sleeve Gastrectomy. Seen via Telehealth (with video) for diet/lifestyle counseling prior to surgery.        Nutrition Assessment of Patient:  Height: 188 cm (6' 2.02)  Weight: (!) 215.5 kg (475 lb)  BMI (Calculated): 60.96  BMI Categories Adult: Obesity Class III: 40 and over  Estimated Protein Needs: 100-120g  Needs to promote: weight loss    Wt Readings from Last 5 Encounters:   07/21/24 (!) 215.5 kg (475 lb)   06/02/24 (!) 201.4 kg (444 lb)   02/23/24 (!) 200.5 kg (442 lb)   02/09/24 (!) 204.1 kg (450 lb)   01/15/24 (!) 199.6 kg (440 lb)        Body mass index is 60.96 kg/m?SABRA       Iron   Date Value Ref Range Status   06/02/2024 55 50 - 185 ?g/dL Final     Iron Binding-TIBC   Date Value Ref Range Status   06/02/2024 380 270 - 380 mcg/dL Final     % Saturation   Date Value Ref Range Status   06/02/2024 14 (L) 28 - 42 % Final     Transferrin   Date Value Ref Range Status   06/02/2024 255 185 - 336 mg/dL Final     Ferritin   Date Value Ref Range Status   06/02/2024 107 30 - 300 ng/mL Final     Vitamin D (25-OH) Total   Date Value Ref Range Status   06/02/2024 21 (L) 30 - 80 ng/mL Final     Vitamin B12   Date Value Ref Range Status   06/02/2024 588 180 - 914 pg/mL Final        Current medications:  Your Current Medications:         Instructions    busPIRone (BUSPAR) 10 mg tablet Take one tablet by mouth twice daily.    duloxetine DR (CYMBALTA) 60 mg capsule Take two capsules by mouth at bedtime daily.    ERGOcalciferoL (vitamin D2) (DRISDOL) 1,250 mcg (50,000 unit) capsule Take one capsule by mouth twice weekly for 16 doses. Indications: vitamin D deficiency (high dose therapy)    meclizine (ANTIVERT) 25 mg tablet Take one tablet by mouth as Needed.    meloxicam (MOBIC) 7.5 mg tablet Take one tablet by mouth twice daily.    olmesartan -hydroCHLOROthiazide  (BENICAR  HCT) 20-12.5 mg tablet Take one tablet by mouth daily. pregabalin  (LYRICA ) 150 mg capsule Take one capsule by mouth three times daily.    rizatriptan (MAXALT-MLT) 10 mg rapid dissolve tablet Dissolve one tablet by mouth as Needed.    rosuvastatin  (CRESTOR ) 10 mg tablet Take one tablet by mouth daily.    tiZANidine (ZANAFLEX) 4 mg tablet Take one tablet by mouth at bedtime as needed.            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, wife is a runner, broadcasting/film/video, has a 9 year old son, 67 year old daughter  his main goal is to be more active and able to attend sporting events and his children's events   he is taking a multivitamin and vitamin D3 script + iron  attempting to cut out sugar and soda - currently drinking diet soda  reports he likes  fruits and vegetables, lean proteins like shrimp, seafood, chicken  likes to drink milk and beverages with caffeine, acknowledges that he needs to drink more water  weight loss goal 290lb, hoping    Diet Recall:   B: poptart  L: sandwich  Dinner: meal with family  Snack: ice cream     Liquids/Beverages: attempting to drink more water, gatorade, diet soda, tea, 1% milk, juice     Daily Physical Activity: limited    Eating Behaviors (nighttime eating, binge eating, meals outside the home etc): nighttime eating  Mealtime Habits: he is trying to use smaller plates      Nutrition Intervention/Plan:  Provided diet recommendations for pre- and post-bariatric surgery:  Pre-op diet recommendations; protein, fluid, and weight loss goals  Post-op diet progression- clear liquids, full liquids, pureed/blenderized, regular  Recommended foods for each diet stage, protein powders and supplements  Discussed the bariatric plate for proper portioning, meal planning and choosing high quality foods.  Recommendations for avoidance of dumping syndrome, dehydration, nausea/vomiting, and constipation  No fluids with food, Avoid concentrated sweets, limit carbonation and caffeine  Mindful eating practices such as chewing well, taking 20-30 minutes to eat a meal, physical vs emotional hunger  Food label reading and heart healthy cooking tips  Recommended bariatric vitamin/mineral supplements   Addressed nutrition-related behaviors (listed above)      He made the following nutrition plan to work on to prepare for surgery:  Practice bariatric plate method: eat 3-6 oz protein first, 1/2 cup low-carb veggies next and 1 serving carb last. No fluids with meals.  Practice mindful eating: Chew well, slow eating, no distractions  Increase Physical Activity to 150 mins moderate intensity activity per week; walking with initial goal of 5000-7500+ steps per day  Consistent meals 4 times per day with at least 15g protein per meal  10lb weight loss by next visit  Continuing to limit/avoid sugar-sweetened beverages and milk serving only 1time per day     Nutrition Monitoring and Evaluation:  Goal: Weight loss toward normal BMI range; Implementation of Lifestyle Habits; Knowledge of MBS, weight expectations and impact of surgery on current eating behaviors and habits    Time Frame: until surgery date     Follow up: 2/5 or earlier (1 week)     Anastasia Molt, RD,CSOWM, LD   Clinical Nutrition Specialist  Available on Voalte

## 2024-07-29 ENCOUNTER — Encounter: Admit: 2024-07-29 | Discharge: 2024-07-29 | Payer: PRIVATE HEALTH INSURANCE

## 2024-07-29 DIAGNOSIS — E559 Vitamin D deficiency, unspecified: Principal | ICD-10-CM

## 2024-07-29 DIAGNOSIS — E611 Iron deficiency: Secondary | ICD-10-CM

## 2024-07-29 MED ORDER — ERGOCALCIFEROL (VITAMIN D2) 1,250 MCG (50,000 UNIT) PO CAP
ORAL_CAPSULE | 1 refills
Start: 2024-07-29 — End: ?

## 2024-08-12 ENCOUNTER — Encounter: Admit: 2024-08-12 | Discharge: 2024-08-12 | Payer: PRIVATE HEALTH INSURANCE

## 2024-09-06 ENCOUNTER — Encounter: Admit: 2024-09-06 | Discharge: 2024-09-06 | Payer: PRIVATE HEALTH INSURANCE
